# Patient Record
Sex: Female | Born: 1960 | Race: White | Hispanic: No | Marital: Married | Smoking: Never smoker
Health system: Southern US, Community
[De-identification: ages and names within clinical notes are randomized; demographics above are authoritative.]

## PROBLEM LIST (undated history)

## (undated) DIAGNOSIS — Z8709 Personal history of other diseases of the respiratory system: Secondary | ICD-10-CM

## (undated) DIAGNOSIS — G4700 Insomnia, unspecified: Secondary | ICD-10-CM

## (undated) DIAGNOSIS — I10 Essential (primary) hypertension: Secondary | ICD-10-CM

## (undated) DIAGNOSIS — I341 Nonrheumatic mitral (valve) prolapse: Secondary | ICD-10-CM

## (undated) DIAGNOSIS — R0602 Shortness of breath: Secondary | ICD-10-CM

## (undated) DIAGNOSIS — M1990 Unspecified osteoarthritis, unspecified site: Secondary | ICD-10-CM

## (undated) DIAGNOSIS — R1900 Intra-abdominal and pelvic swelling, mass and lump, unspecified site: Secondary | ICD-10-CM

## (undated) DIAGNOSIS — N390 Urinary tract infection, site not specified: Secondary | ICD-10-CM

## (undated) DIAGNOSIS — J189 Pneumonia, unspecified organism: Secondary | ICD-10-CM

## (undated) DIAGNOSIS — K5900 Constipation, unspecified: Secondary | ICD-10-CM

## (undated) DIAGNOSIS — K649 Unspecified hemorrhoids: Secondary | ICD-10-CM

## (undated) DIAGNOSIS — Z950 Presence of cardiac pacemaker: Secondary | ICD-10-CM

## (undated) DIAGNOSIS — I509 Heart failure, unspecified: Secondary | ICD-10-CM

## (undated) DIAGNOSIS — E785 Hyperlipidemia, unspecified: Secondary | ICD-10-CM

## (undated) DIAGNOSIS — M2550 Pain in unspecified joint: Secondary | ICD-10-CM

## (undated) DIAGNOSIS — Z8614 Personal history of Methicillin resistant Staphylococcus aureus infection: Secondary | ICD-10-CM

## (undated) DIAGNOSIS — M62838 Other muscle spasm: Secondary | ICD-10-CM

## (undated) DIAGNOSIS — I776 Arteritis, unspecified: Secondary | ICD-10-CM

## (undated) DIAGNOSIS — G459 Transient cerebral ischemic attack, unspecified: Secondary | ICD-10-CM

## (undated) HISTORY — PX: OTHER SURGICAL HISTORY: SHX169

## (undated) HISTORY — PX: ANKLE SURGERY: SHX546

## (undated) HISTORY — PX: ATRIAL CARDIAC PACEMAKER INSERTION: SHX561

## (undated) HISTORY — PX: CARDIAC DEFIBRILLATOR PLACEMENT: SHX171

## (undated) HISTORY — PX: COLONOSCOPY: SHX174

## (undated) HISTORY — PX: LEFT VENTRICULAR ASSIST DEVICE: SHX2679

## (undated) HISTORY — PX: BLADDER SUSPENSION: SHX72

---

## 1998-05-06 ENCOUNTER — Other Ambulatory Visit: Admission: RE | Admit: 1998-05-06 | Discharge: 1998-05-06 | Payer: Self-pay | Admitting: Gynecology

## 2000-10-17 ENCOUNTER — Other Ambulatory Visit: Admission: RE | Admit: 2000-10-17 | Discharge: 2000-10-17 | Payer: Self-pay | Admitting: Gynecology

## 2001-02-06 ENCOUNTER — Encounter: Admission: RE | Admit: 2001-02-06 | Discharge: 2001-05-07 | Payer: Self-pay | Admitting: Gynecology

## 2005-04-14 ENCOUNTER — Ambulatory Visit (HOSPITAL_COMMUNITY): Admission: RE | Admit: 2005-04-14 | Discharge: 2005-04-14 | Payer: Self-pay | Admitting: Gynecology

## 2005-04-17 ENCOUNTER — Inpatient Hospital Stay (HOSPITAL_COMMUNITY): Admission: RE | Admit: 2005-04-17 | Discharge: 2005-04-20 | Payer: Self-pay | Admitting: Gynecology

## 2008-01-10 DIAGNOSIS — G459 Transient cerebral ischemic attack, unspecified: Secondary | ICD-10-CM

## 2008-01-10 HISTORY — DX: Transient cerebral ischemic attack, unspecified: G45.9

## 2010-01-09 DIAGNOSIS — Z8614 Personal history of Methicillin resistant Staphylococcus aureus infection: Secondary | ICD-10-CM

## 2010-01-09 HISTORY — DX: Personal history of Methicillin resistant Staphylococcus aureus infection: Z86.14

## 2013-01-09 HISTORY — PX: CARDIAC CATHETERIZATION: SHX172

## 2013-02-17 ENCOUNTER — Other Ambulatory Visit: Payer: Self-pay | Admitting: Orthopedic Surgery

## 2013-02-17 DIAGNOSIS — M542 Cervicalgia: Secondary | ICD-10-CM

## 2013-02-19 ENCOUNTER — Ambulatory Visit
Admission: RE | Admit: 2013-02-19 | Discharge: 2013-02-19 | Disposition: A | Discharge status: Home / Self Care | Payer: Managed Care, Other (non HMO) | Source: Ambulatory Visit | Attending: Orthopedic Surgery | Admitting: Orthopedic Surgery

## 2013-02-19 VITALS — BP 100/61 | HR 76

## 2013-02-19 DIAGNOSIS — M542 Cervicalgia: Secondary | ICD-10-CM

## 2013-02-19 MED ORDER — DIAZEPAM 5 MG PO TABS
10.0000 mg | ORAL_TABLET | Freq: Once | ORAL | Status: AC
  Administered 2013-02-19: 10 mg via ORAL

## 2013-02-19 MED ORDER — IOHEXOL 300 MG/ML  SOLN
10.0000 mL | Freq: Once | INTRAMUSCULAR | Status: AC | PRN
  Administered 2013-02-19: 10 mL via INTRATHECAL

## 2013-02-19 MED ORDER — HYDROMORPHONE HCL PF 1 MG/ML IJ SOLN
1.0000 mg | Freq: Once | INTRAMUSCULAR | Status: AC
  Administered 2013-02-19: 1 mg via INTRAMUSCULAR

## 2013-02-19 MED ORDER — ONDANSETRON HCL 4 MG/2ML IJ SOLN: 4.0000 mg | Freq: Four times a day (QID) | INTRAMUSCULAR | Status: DC | PRN

## 2013-02-19 MED ORDER — ONDANSETRON HCL 4 MG/2ML IJ SOLN
4.0000 mg | Freq: Once | INTRAMUSCULAR | Status: AC
  Administered 2013-02-19: 4 mg via INTRAMUSCULAR

## 2013-02-19 NOTE — Progress Notes (Signed)
Patient confirms she took 13-hour prep as prescribed for iodinated contrast allergy.

## 2013-02-19 NOTE — Discharge Instructions (Signed)

## 2013-06-13 ENCOUNTER — Other Ambulatory Visit: Payer: Self-pay | Admitting: Orthopedic Surgery

## 2013-06-26 ENCOUNTER — Encounter (HOSPITAL_COMMUNITY): Payer: Self-pay | Admitting: Pharmacy Technician

## 2013-06-26 NOTE — Pre-Procedure Instructions (Addendum)
Veronica PaschalChristy Walker  06/26/2013   Your procedure is scheduled on:  Wed, June 24 @ 1:40 PM  Report to  at [facility] A @ 10:00 AM.  Call this number if you have problems the morning of surgery: [phone]   Remember:   Do not eat food or drink liquids after midnight.   Take these medicines the morning of surgery with A SIP OF WATER: Ativan(Lorazepam) and Metoprolol(Toprol)               Stop taking your Fish Oil and Aspirin. No Goody's,BC's,Aleve,Aspirin,Ibuprofen,or any Herbal Medications   Do not wear jewelry, make-up or nail polish.  Do not wear lotions, powders, or perfumes. You may wear deodorant.  Do not shave 48 hours prior to surgery.   Do not bring valuables to the hospital.  [facility] is not responsible                  for any belongings or valuables.               Contacts, dentures or bridgework may not be worn into surgery.  Leave suitcase in the car. After surgery it may be brought to your room.  For patients admitted to the hospital, discharge time is determined by your                treatment team.               Patients discharged the day of surgery will not be allowed to drive  home.    Special Instructions:   - Preparing for Surgery  Before surgery, you can play an important role.  Because skin is not sterile, your skin needs to be as free of germs as possible.  You can reduce the number of germs on you skin by washing with CHG (chlorahexidine gluconate) soap before surgery.  CHG is an antiseptic cleaner which kills germs and bonds with the skin to continue killing germs even after washing.  Please DO NOT use if you have an allergy to CHG or antibacterial soaps.  If your skin becomes reddened/irritated stop using the CHG and inform your nurse when you arrive at Short Stay.  Do not shave (including legs and underarms) for at least 48 hours prior to the first CHG shower.  You may shave your face.  Please follow these instructions  carefully:   1.  Shower with CHG Soap the night before surgery and the                                morning of Surgery.  2.  If you choose to wash your hair, wash your hair first as usual with your       normal shampoo.  3.  After you shampoo, rinse your hair and body thoroughly to remove the                      Shampoo.  4.  Use CHG as you would any other liquid soap.  You can apply chg directly       to the skin and wash gently with scrungie or a clean washcloth.  5.  Apply the CHG Soap to your body ONLY FROM THE NECK DOWN.        Do not use on open wounds or open sores.  Avoid contact with your eyes,  ears, mouth and genitals (private parts).  Wash genitals (private parts)       with your normal soap.  6.  Wash thoroughly, paying special attention to the area where your surgery        will be performed.  7.  Thoroughly rinse your body with warm water from the neck down.  8.  DO NOT shower/wash with your normal soap after using and rinsing off       the CHG Soap.  9.  Pat yourself dry with a clean towel.            10.  Wear clean pajamas.            11.  Place clean sheets on your bed the night of your first shower and do not        sleep with pets.  Day of Surgery  Do not apply any lotions/deoderants the morning of surgery.  Please wear clean clothes to the hospital/surgery center.     Please read over the following fact sheets that you were given: Pain Booklet, Coughing and Deep Breathing, Blood Transfusion Information, MRSA Information and Surgical Site Infection Prevention

## 2013-06-27 ENCOUNTER — Ambulatory Visit (HOSPITAL_COMMUNITY)
Admission: RE | Admit: 2013-06-27 | Discharge: 2013-06-27 | Disposition: A | Discharge status: Home / Self Care | Payer: Worker's Compensation | Source: Ambulatory Visit | Attending: Orthopedic Surgery | Admitting: Orthopedic Surgery

## 2013-06-27 ENCOUNTER — Encounter (HOSPITAL_COMMUNITY): Payer: Self-pay

## 2013-06-27 ENCOUNTER — Encounter (HOSPITAL_COMMUNITY)
Admission: RE | Admit: 2013-06-27 | Discharge: 2013-06-27 | Disposition: A | Discharge status: Home / Self Care | Payer: Worker's Compensation | Source: Ambulatory Visit | Attending: Orthopedic Surgery | Admitting: Orthopedic Surgery

## 2013-06-27 DIAGNOSIS — R05 Cough: Secondary | ICD-10-CM | POA: Diagnosis not present

## 2013-06-27 DIAGNOSIS — Z01818 Encounter for other preprocedural examination: Secondary | ICD-10-CM | POA: Diagnosis present

## 2013-06-27 DIAGNOSIS — R059 Cough, unspecified: Secondary | ICD-10-CM | POA: Insufficient documentation

## 2013-06-27 HISTORY — DX: Hyperlipidemia, unspecified: E78.5

## 2013-06-27 HISTORY — DX: Constipation, unspecified: K59.00

## 2013-06-27 HISTORY — DX: Insomnia, unspecified: G47.00

## 2013-06-27 HISTORY — DX: Presence of cardiac pacemaker: Z95.0

## 2013-06-27 HISTORY — DX: Personal history of Methicillin resistant Staphylococcus aureus infection: Z86.14

## 2013-06-27 HISTORY — DX: Unspecified hemorrhoids: K64.9

## 2013-06-27 HISTORY — DX: Nonrheumatic mitral (valve) prolapse: I34.1

## 2013-06-27 HISTORY — DX: Other muscle spasm: M62.838

## 2013-06-27 HISTORY — DX: Arteritis, unspecified: I77.6

## 2013-06-27 HISTORY — DX: Pain in unspecified joint: M25.50

## 2013-06-27 HISTORY — DX: Essential (primary) hypertension: I10

## 2013-06-27 HISTORY — DX: Shortness of breath: R06.02

## 2013-06-27 HISTORY — DX: Unspecified osteoarthritis, unspecified site: M19.90

## 2013-06-27 HISTORY — DX: Intra-abdominal and pelvic swelling, mass and lump, unspecified site: R19.00

## 2013-06-27 HISTORY — DX: Heart failure, unspecified: I50.9

## 2013-06-27 HISTORY — DX: Pneumonia, unspecified organism: J18.9

## 2013-06-27 HISTORY — DX: Transient cerebral ischemic attack, unspecified: G45.9

## 2013-06-27 HISTORY — DX: Personal history of other diseases of the respiratory system: Z87.09

## 2013-06-27 LAB — CBC WITH DIFFERENTIAL/PLATELET
BASOS ABS: 0 10*3/uL (ref 0.0–0.1)
Basophils Relative: 0 % (ref 0–1)
EOS PCT: 4 % (ref 0–5)
Eosinophils Absolute: 0.1 10*3/uL (ref 0.0–0.7)
HCT: 41.8 % (ref 36.0–46.0)
HEMOGLOBIN: 13.8 g/dL (ref 12.0–15.0)
LYMPHS ABS: 1.1 10*3/uL (ref 0.7–4.0)
LYMPHS PCT: 27 % (ref 12–46)
MCH: 28.6 pg (ref 26.0–34.0)
MCHC: 33 g/dL (ref 30.0–36.0)
MCV: 86.5 fL (ref 78.0–100.0)
MONO ABS: 0.4 10*3/uL (ref 0.1–1.0)
MONOS PCT: 10 % (ref 3–12)
Neutro Abs: 2.3 10*3/uL (ref 1.7–7.7)
Neutrophils Relative %: 59 % (ref 43–77)
Platelets: 186 10*3/uL (ref 150–400)
RBC: 4.83 MIL/uL (ref 3.87–5.11)
RDW: 14.1 % (ref 11.5–15.5)
WBC: 4 10*3/uL (ref 4.0–10.5)

## 2013-06-27 LAB — URINALYSIS, ROUTINE W REFLEX MICROSCOPIC
Bilirubin Urine: NEGATIVE
GLUCOSE, UA: NEGATIVE mg/dL
Hgb urine dipstick: NEGATIVE
Ketones, ur: NEGATIVE mg/dL
Nitrite: NEGATIVE
PH: 6.5 (ref 5.0–8.0)
Protein, ur: NEGATIVE mg/dL
Specific Gravity, Urine: 1.012 (ref 1.005–1.030)
Urobilinogen, UA: 0.2 mg/dL (ref 0.0–1.0)

## 2013-06-27 LAB — COMPREHENSIVE METABOLIC PANEL
ALT: 15 U/L (ref 0–35)
AST: 19 U/L (ref 0–37)
Albumin: 4 g/dL (ref 3.5–5.2)
Alkaline Phosphatase: 79 U/L (ref 39–117)
BILIRUBIN TOTAL: 0.7 mg/dL (ref 0.3–1.2)
BUN: 21 mg/dL (ref 6–23)
CHLORIDE: 103 meq/L (ref 96–112)
CO2: 29 meq/L (ref 19–32)
CREATININE: 0.96 mg/dL (ref 0.50–1.10)
Calcium: 10.2 mg/dL (ref 8.4–10.5)
GFR calc Af Amer: 78 mL/min — ABNORMAL LOW (ref >90)
GFR, EST NON AFRICAN AMERICAN: 67 mL/min — AB (ref >90)
GLUCOSE: 91 mg/dL (ref 70–99)
Potassium: 4.3 mEq/L (ref 3.7–5.3)
Sodium: 143 mEq/L (ref 137–147)
Total Protein: 7.1 g/dL (ref 6.0–8.3)

## 2013-06-27 LAB — SURGICAL PCR SCREEN
MRSA, PCR: NEGATIVE
Staphylococcus aureus: POSITIVE — AB

## 2013-06-27 LAB — TYPE AND SCREEN
ABO/RH(D): A POS
Antibody Screen: NEGATIVE

## 2013-06-27 LAB — PROTIME-INR
INR: 0.94 (ref 0.00–1.49)
PROTHROMBIN TIME: 12.4 s (ref 11.6–15.2)

## 2013-06-27 LAB — URINE MICROSCOPIC-ADD ON

## 2013-06-27 LAB — APTT: aPTT: 29 seconds (ref 24–37)

## 2013-06-27 LAB — ABO/RH: ABO/RH(D): A POS

## 2013-06-27 MED ORDER — POVIDONE-IODINE 7.5 % EX SOLN: Freq: Once | CUTANEOUS | Status: DC

## 2013-06-27 NOTE — Progress Notes (Signed)
Spoke with Dr.Smith about pt and history,he doesn't need to see pt today but just to make sure records and [facility]

## 2013-06-27 NOTE — Progress Notes (Signed)
[facility]last visit 2wks [facility]  Denies ever having a stress test  Echo and Heart cath done in APril [facility]  EKG done 05/19/13-[facility]  Denies CXR in past yr  Medical Md is with [facility]phanie [facility]ola [facility] [address]

## 2013-06-27 NOTE — Progress Notes (Signed)
[employer] rep notified of pt upcoming surgery 07/02/13 @ 1:40 PM

## 2013-06-28 NOTE — Progress Notes (Signed)
[facility]iro[facility]. Call and spoke with Arpita, RPh and called in prescription for mupirocin per short stay protocol. Patient notified.

## 2013-06-30 NOTE — Progress Notes (Addendum)
Anesthesia Chart Review: Patient is a 53 year old female scheduled for C5-6, C6-7 ACDF on 07/02/13 by Dr. Yevette Edwardsumonski.  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 Lolita LenzEdward Hal Kincaid), s/p [facility] "defibrillator" 01/05/2012, chronic systolic CHF (EF 09-81%15-20% 04/2013 echo), TIA '10, chronic DOE, arthritis, vasculitis, insomnia, MRSA '12. PCP is [place] Lau[place]phanie [place] [facility] ([address]  Rheumatologist is Dr. Denyse DagoAmer Al-Khoudari with [facility] who cleared her from his standpoint.  He gave permission to hold Imuran preoperatively.  Primary cardiologist is Dr. Ernest Haberhukwu with [facility].  She was seen by her partner Dr. Janett BillowBarbara Ann Pisani at [facility] for preoperative clearance on 06/17/13. Her note states, "There is no cardiovascular contraindication for your proposed surgery. Based on revised Nedra HaiLee criteria her risk of a periop event (MI, PE, VF, cardiac arrest, or CHD) is 6.6%, due to  non-modifiable risk factors of CAD and CHF. However, risk is lower (0.46%) based on [facility] perioperative assessment..." (See entire note in patient's hard chart.)   [facility] showed: Severely dilated LV, normal LV wall thickness, LV systolic function severely reduced with EF 15-20%, LV filling pattern in indeterminate, severe global hypokinesis of the LV.  [place] lead in the RV.  Normal RV size and function. Mildly dilated LA.    Cardiac cath on 04/24/13 [facility]) showed: Obstructive two-vessel CAD. Left main disease. Aortogram showed occluded left main and patent RCA. Left main occluded at ostium. LIMA to LAD patent. RCA widely patent with brisk collateral filling of left circumflex. Pressures: Site Baseline Values, mm Hg S/P  angiography, mm Hg S/D mean S/D mean: RA 7 / 7 6  RV 33 / 6 PA 39 / 19 27 PCW 17 / 17 13 Oximetry: Site % O2 sat RA 68 PR 68 AO 96  CXR on 06/27/13 showed: FINDINGS: Interval sequelae of median sternotomy, cardiac valve replacement, and left chest cardiac AICD placement. Stable cardiac size and mediastinal contours. Small right cardiophrenic angle pericardial cyst incidentally re- identified. Visualized tracheal air column is  within normal limits. No pneumothorax, pulmonary edema, pleural effusion or acute pulmonary opacity. No acute osseous abnormality identified. IMPRESSION: No acute cardiopulmonary abnormality.  Preoperative labs noted. UA showed large leukocytes but negative nitrites, few bacteria.  CBC with diff WNL (WBC low normal at 4.0).  WBC and [facility] at [facility]. Marshell Levanumonski's office.    [facility] was from 02/28/12, so a more recent baseline EKG will need to be done on the day of surgery.  I did receive the completed the perioperative cardiac device form indicating that patient does have a [employer] ICD.  Magnet placement with post-operative interrogation recommended. Last interrogation 05/19/13.  I will ask our nursing staff to notify the rep.  She will be re-evaluated by her assigned anesthesiologist on the day of surgery to ensure no acute CV/CHF symptoms. EKG will be done.  If no acute changes then I would anticipate that she could proceed as planned.  Velna Ochsllison Zelenak, PA-C [facility] Short Stay Center/Anesthesiology Phone [phone] 07/01/2013 10:28 AM

## 2013-07-01 ENCOUNTER — Encounter (HOSPITAL_COMMUNITY): Payer: Self-pay

## 2013-07-01 MED ORDER — CEFAZOLIN SODIUM-DEXTROSE 2-3 GM-% IV SOLR
2.0000 g | INTRAVENOUS | Status: AC
  Administered 2013-07-02: 2 g via INTRAVENOUS
  Filled 2013-07-01: qty 50

## 2013-07-01 NOTE — H&P (Signed)
PREOPERATIVE H&P  Chief Complaint: left arm pain  HPI: Veronica Walker is a 53 y.o. female who presents with ongoing pain in the left arm  MRI reveals left NF stenosis C5-7  Patient has failed multiple forms of conservative care and continues to have pain (see office notes for additional details regarding the patient's full course of treatment)  Past Medical History  Diagnosis Date  . Hyperlipidemia     takes Atorvastatin daily  . Muscle spasm     takes Flexeril daily  . Hypertension   . Vasculitis     takes Lisinopril and Metoprolol daily  . Abdominal swelling     takes Furosemide daily  . MVP (mitral valve prolapse)   . Shortness of breath     with exertion  . Pneumonia 4246yrs ago  . History of bronchitis   . TIA (transient ischemic attack) 2010  . Arthritis   . Joint pain   . Hemorrhoids   . Constipation     takes Colace daily  . Insomnia     d/t pain and unknown other reasons  . History of MRSA infection 2012  . CHF (congestive heart failure)     chronic systolic CHF  . Pacemaker     atrial cardiac pacemaker; s/p [employer] ICD 01/05/12   Past Surgical History  Procedure Laterality Date  . Ankle surgery Bilateral   . Mitral and aortic valve replaced  2010/2012    [facility]  . Partial hysterecomy    . I&d to right abdomen      d/t MRSA  . Cardiac catheterization  2015  . Colonoscopy    . Bladder suspension    . Atrial cardiac pacemaker insertion    . Cardiac defibrillator placement      [employer]    History   Social History  . Marital Status: Married    Spouse Name: N/A    Number of Children: N/A  . Years of Education: N/A   Social History Main Topics  . Smoking status: Never Smoker   . Smokeless tobacco: Not on file  . Alcohol Use: Yes     Comment: 2-3 times a yr wine  . Drug Use: No  . Sexual Activity: Yes    Birth Control/ Protection: Surgical   Other Topics Concern  . Not on file   Social History Narrative    . No narrative on file   No family history on file. Allergies  Allergen Reactions  . Contrast Media [Iodinated Diagnostic Agents] Shortness Of Breath, Itching and Other (See Comments)    A sensation of burning all over   Pre-med 13 hour prep  . Morphine And Related Nausea Only   Prior to Admission medications   Medication Sig Start Date End Date Taking? Authorizing Provider  aspirin EC 81 MG tablet Take 81 mg by mouth daily.   Yes Historical Provider, MD  atorvastatin (LIPITOR) 40 MG tablet Take 40 mg by mouth daily.   Yes Historical Provider, MD  azaTHIOprine (IMURAN) 50 MG tablet Take 50 mg by mouth 3 (three) times daily.   Yes Historical Provider, MD  CALCIUM-VITAMIN D PO Take 1 tablet by mouth daily.   Yes Historical Provider, MD  cyclobenzaprine (FLEXERIL) 5 MG tablet Take 5 mg by mouth 3 (three) times daily.   Yes Historical Provider, MD  docusate sodium (COLACE) 100 MG capsule Take 100 mg by mouth daily.   Yes Historical Provider, MD  furosemide (LASIX) 40  MG tablet Take 40 mg by mouth daily.   Yes Historical Provider, MD  lisinopril (PRINIVIL,ZESTRIL) 2.5 MG tablet Take 5 mg by mouth 2 (two) times daily.   Yes Historical Provider, MD  LORazepam (ATIVAN) 0.5 MG tablet Take 0.5 mg by mouth every 8 (eight) hours as needed for anxiety or sleep.   Yes Historical Provider, MD  metoprolol succinate (TOPROL-XL) 25 MG 24 hr tablet Take 25-50 mg by mouth 2 (two) times daily. Take 50mg  in the morning and take 25mg  at bedtime.   Yes Historical Provider, MD  Omega-3 Fatty Acids (FISH OIL) 1000 MG CAPS Take 2,000 mg by mouth daily.   Yes Historical Provider, MD  potassium chloride (K-DUR,KLOR-CON) 10 MEQ tablet Take 10 mEq by mouth daily.   Yes Historical Provider, MD     All other systems have been reviewed and were otherwise negative with the exception of those mentioned in the HPI and as above.  Physical Exam: There were no vitals filed for this visit.  General: Alert, no acute  distress Cardiovascular: No pedal edema Respiratory: No cyanosis, no use of accessory musculature Skin: No lesions in the area of chief complaint Neurologic: Sensation intact distally Psychiatric: Patient is competent for consent with normal mood and affect Lymphatic: No axillary or cervical lymphadenopathy  MUSCULOSKELETAL: + spurling's on left  Assessment/Plan: Neck and left arm pain Plan for Procedure(s): ANTERIOR CERVICAL DECOMPRESSION/DISCECTOMY FUSION  C5-7   Emilee HeroUMONSKI,MARK LEONARD, MD 07/01/2013 10:28 PM

## 2013-07-02 ENCOUNTER — Ambulatory Visit (HOSPITAL_COMMUNITY): Payer: Worker's Compensation | Admitting: Anesthesiology

## 2013-07-02 ENCOUNTER — Encounter (HOSPITAL_COMMUNITY): Payer: Worker's Compensation | Admitting: Vascular Surgery

## 2013-07-02 ENCOUNTER — Observation Stay (HOSPITAL_COMMUNITY)
Admission: RE | Admit: 2013-07-02 | Discharge: 2013-07-03 | Disposition: A | Discharge status: Home / Self Care | Payer: Worker's Compensation | Source: Ambulatory Visit | Attending: Orthopedic Surgery | Admitting: Orthopedic Surgery

## 2013-07-02 ENCOUNTER — Encounter (HOSPITAL_COMMUNITY): Payer: Self-pay | Admitting: *Deleted

## 2013-07-02 ENCOUNTER — Ambulatory Visit (HOSPITAL_COMMUNITY): Payer: Worker's Compensation

## 2013-07-02 ENCOUNTER — Encounter (HOSPITAL_COMMUNITY)
Admission: RE | Disposition: A | Discharge status: Home / Self Care | Payer: Self-pay | Source: Ambulatory Visit | Attending: Orthopedic Surgery

## 2013-07-02 DIAGNOSIS — I5022 Chronic systolic (congestive) heart failure: Secondary | ICD-10-CM | POA: Insufficient documentation

## 2013-07-02 DIAGNOSIS — Z7982 Long term (current) use of aspirin: Secondary | ICD-10-CM | POA: Insufficient documentation

## 2013-07-02 DIAGNOSIS — Z8673 Personal history of transient ischemic attack (TIA), and cerebral infarction without residual deficits: Secondary | ICD-10-CM | POA: Insufficient documentation

## 2013-07-02 DIAGNOSIS — M5410 Radiculopathy, site unspecified: Secondary | ICD-10-CM | POA: Diagnosis present

## 2013-07-02 DIAGNOSIS — G4700 Insomnia, unspecified: Secondary | ICD-10-CM | POA: Insufficient documentation

## 2013-07-02 DIAGNOSIS — Z79899 Other long term (current) drug therapy: Secondary | ICD-10-CM | POA: Insufficient documentation

## 2013-07-02 DIAGNOSIS — K5900 Constipation, unspecified: Secondary | ICD-10-CM | POA: Insufficient documentation

## 2013-07-02 DIAGNOSIS — M5412 Radiculopathy, cervical region: Secondary | ICD-10-CM

## 2013-07-02 DIAGNOSIS — I059 Rheumatic mitral valve disease, unspecified: Secondary | ICD-10-CM | POA: Insufficient documentation

## 2013-07-02 DIAGNOSIS — M5030 Other cervical disc degeneration, unspecified cervical region: Principal | ICD-10-CM | POA: Insufficient documentation

## 2013-07-02 DIAGNOSIS — I509 Heart failure, unspecified: Secondary | ICD-10-CM | POA: Insufficient documentation

## 2013-07-02 DIAGNOSIS — I10 Essential (primary) hypertension: Secondary | ICD-10-CM | POA: Insufficient documentation

## 2013-07-02 DIAGNOSIS — Z8614 Personal history of Methicillin resistant Staphylococcus aureus infection: Secondary | ICD-10-CM | POA: Insufficient documentation

## 2013-07-02 DIAGNOSIS — I776 Arteritis, unspecified: Secondary | ICD-10-CM | POA: Insufficient documentation

## 2013-07-02 DIAGNOSIS — M62838 Other muscle spasm: Secondary | ICD-10-CM | POA: Insufficient documentation

## 2013-07-02 DIAGNOSIS — Z950 Presence of cardiac pacemaker: Secondary | ICD-10-CM | POA: Insufficient documentation

## 2013-07-02 DIAGNOSIS — E785 Hyperlipidemia, unspecified: Secondary | ICD-10-CM | POA: Insufficient documentation

## 2013-07-02 DIAGNOSIS — M129 Arthropathy, unspecified: Secondary | ICD-10-CM | POA: Insufficient documentation

## 2013-07-02 HISTORY — PX: ANTERIOR CERVICAL DECOMP/DISCECTOMY FUSION: SHX1161

## 2013-07-02 HISTORY — DX: Urinary tract infection, site not specified: N39.0

## 2013-07-02 SURGERY — ANTERIOR CERVICAL DECOMPRESSION/DISCECTOMY FUSION 2 LEVELS
Anesthesia: General | Site: Neck

## 2013-07-02 MED ORDER — SODIUM CHLORIDE 0.9 % IJ SOLN
3.0000 mL | Freq: Two times a day (BID) | INTRAMUSCULAR | Status: DC
  Administered 2013-07-02: 3 mL via INTRAVENOUS

## 2013-07-02 MED ORDER — LORAZEPAM 0.5 MG PO TABS: 0.5000 mg | ORAL_TABLET | Freq: Three times a day (TID) | ORAL | Status: DC | PRN

## 2013-07-02 MED ORDER — ONDANSETRON HCL 4 MG/2ML IJ SOLN
INTRAMUSCULAR | Status: AC
  Filled 2013-07-02: qty 2

## 2013-07-02 MED ORDER — OXYCODONE HCL 5 MG/5ML PO SOLN: 5.0000 mg | Freq: Once | ORAL | Status: AC | PRN

## 2013-07-02 MED ORDER — ETOMIDATE 2 MG/ML IV SOLN
INTRAVENOUS | Status: AC
  Filled 2013-07-02: qty 10

## 2013-07-02 MED ORDER — MIDAZOLAM HCL 2 MG/2ML IJ SOLN
INTRAMUSCULAR | Status: AC
  Filled 2013-07-02: qty 2

## 2013-07-02 MED ORDER — FUROSEMIDE 40 MG PO TABS
40.0000 mg | ORAL_TABLET | Freq: Every day | ORAL | Status: DC
  Administered 2013-07-03: 40 mg via ORAL
  Filled 2013-07-02: qty 1

## 2013-07-02 MED ORDER — DOCUSATE SODIUM 100 MG PO CAPS
100.0000 mg | ORAL_CAPSULE | Freq: Two times a day (BID) | ORAL | Status: DC
  Administered 2013-07-03: 100 mg via ORAL
  Filled 2013-07-02 (×3): qty 1

## 2013-07-02 MED ORDER — OXYCODONE HCL 5 MG PO TABS
5.0000 mg | ORAL_TABLET | Freq: Once | ORAL | Status: AC | PRN
  Administered 2013-07-02: 5 mg via ORAL

## 2013-07-02 MED ORDER — DIAZEPAM 5 MG PO TABS: 5.0000 mg | ORAL_TABLET | Freq: Four times a day (QID) | ORAL | Status: DC | PRN

## 2013-07-02 MED ORDER — HYDROMORPHONE HCL PF 1 MG/ML IJ SOLN
0.2500 mg | INTRAMUSCULAR | Status: DC | PRN
  Administered 2013-07-02 (×2): 0.5 mg via INTRAVENOUS

## 2013-07-02 MED ORDER — SENNOSIDES-DOCUSATE SODIUM 8.6-50 MG PO TABS: 1.0000 | ORAL_TABLET | Freq: Every evening | ORAL | Status: DC | PRN

## 2013-07-02 MED ORDER — SODIUM CHLORIDE 0.9 % IJ SOLN
INTRAMUSCULAR | Status: AC
  Filled 2013-07-02: qty 10

## 2013-07-02 MED ORDER — LIDOCAINE HCL (CARDIAC) 20 MG/ML IV SOLN
INTRAVENOUS | Status: AC
  Filled 2013-07-02: qty 5

## 2013-07-02 MED ORDER — THROMBIN 20000 UNITS EX SOLR
CUTANEOUS | Status: AC
  Filled 2013-07-02: qty 20000

## 2013-07-02 MED ORDER — DOCUSATE SODIUM 100 MG PO CAPS: 100.0000 mg | ORAL_CAPSULE | Freq: Every day | ORAL | Status: DC

## 2013-07-02 MED ORDER — ROCURONIUM BROMIDE 100 MG/10ML IV SOLN
INTRAVENOUS | Status: DC | PRN
  Administered 2013-07-02: 50 mg via INTRAVENOUS

## 2013-07-02 MED ORDER — PROMETHAZINE HCL 25 MG/ML IJ SOLN
12.5000 mg | Freq: Four times a day (QID) | INTRAMUSCULAR | Status: DC | PRN
  Administered 2013-07-02: 12.5 mg via INTRAVENOUS
  Filled 2013-07-02: qty 1

## 2013-07-02 MED ORDER — PROMETHAZINE HCL 25 MG PO TABS: 12.5000 mg | ORAL_TABLET | Freq: Four times a day (QID) | ORAL | Status: DC | PRN

## 2013-07-02 MED ORDER — ROCURONIUM BROMIDE 50 MG/5ML IV SOLN
INTRAVENOUS | Status: AC
  Filled 2013-07-02: qty 1

## 2013-07-02 MED ORDER — PROPOFOL 10 MG/ML IV BOLUS
INTRAVENOUS | Status: AC
  Filled 2013-07-02: qty 20

## 2013-07-02 MED ORDER — ZOLPIDEM TARTRATE 5 MG PO TABS: 5.0000 mg | ORAL_TABLET | Freq: Every evening | ORAL | Status: DC | PRN

## 2013-07-02 MED ORDER — BISACODYL 5 MG PO TBEC: 5.0000 mg | DELAYED_RELEASE_TABLET | Freq: Every day | ORAL | Status: DC | PRN

## 2013-07-02 MED ORDER — FLEET ENEMA 7-19 GM/118ML RE ENEM
1.0000 | ENEMA | Freq: Once | RECTAL | Status: AC | PRN
  Filled 2013-07-02: qty 1

## 2013-07-02 MED ORDER — ALUM & MAG HYDROXIDE-SIMETH 200-200-20 MG/5ML PO SUSP: 30.0000 mL | Freq: Four times a day (QID) | ORAL | Status: DC | PRN

## 2013-07-02 MED ORDER — ONDANSETRON HCL 4 MG/2ML IJ SOLN
INTRAMUSCULAR | Status: DC | PRN
  Administered 2013-07-02: 4 mg via INTRAVENOUS

## 2013-07-02 MED ORDER — LISINOPRIL 5 MG PO TABS
5.0000 mg | ORAL_TABLET | Freq: Two times a day (BID) | ORAL | Status: DC
  Administered 2013-07-03: 5 mg via ORAL
  Filled 2013-07-02 (×3): qty 1

## 2013-07-02 MED ORDER — PROMETHAZINE HCL 25 MG/ML IJ SOLN: 6.2500 mg | INTRAMUSCULAR | Status: DC | PRN

## 2013-07-02 MED ORDER — SODIUM CHLORIDE 0.9 % IJ SOLN: 3.0000 mL | INTRAMUSCULAR | Status: DC | PRN

## 2013-07-02 MED ORDER — OXYCODONE-ACETAMINOPHEN 5-325 MG PO TABS: 1.0000 | ORAL_TABLET | ORAL | Status: DC | PRN

## 2013-07-02 MED ORDER — FENTANYL CITRATE 0.05 MG/ML IJ SOLN
INTRAMUSCULAR | Status: AC
  Filled 2013-07-02: qty 5

## 2013-07-02 MED ORDER — ARTIFICIAL TEARS OP OINT
TOPICAL_OINTMENT | OPHTHALMIC | Status: AC
  Filled 2013-07-02: qty 3.5

## 2013-07-02 MED ORDER — MENTHOL 3 MG MT LOZG
1.0000 | LOZENGE | OROMUCOSAL | Status: DC | PRN
  Filled 2013-07-02: qty 9

## 2013-07-02 MED ORDER — ACETAMINOPHEN 650 MG RE SUPP
650.0000 mg | RECTAL | Status: DC | PRN
Start: 2013-07-02 — End: 2013-07-03

## 2013-07-02 MED ORDER — SODIUM CHLORIDE 0.9 % IV SOLN
250.0000 mL | INTRAVENOUS | Status: DC
  Administered 2013-07-02: 250 mL via INTRAVENOUS

## 2013-07-02 MED ORDER — HEMOSTATIC AGENTS (NO CHARGE) OPTIME
TOPICAL | Status: DC | PRN
  Administered 2013-07-02: 1 via TOPICAL

## 2013-07-02 MED ORDER — ACETAMINOPHEN 325 MG PO TABS: 650.0000 mg | ORAL_TABLET | ORAL | Status: DC | PRN

## 2013-07-02 MED ORDER — METOPROLOL SUCCINATE ER 25 MG PO TB24
25.0000 mg | ORAL_TABLET | Freq: Every day | ORAL | Status: DC
  Filled 2013-07-02 (×2): qty 1

## 2013-07-02 MED ORDER — ARTIFICIAL TEARS OP OINT
TOPICAL_OINTMENT | OPHTHALMIC | Status: DC | PRN
  Administered 2013-07-02: 1 via OPHTHALMIC

## 2013-07-02 MED ORDER — ETOMIDATE 2 MG/ML IV SOLN
INTRAVENOUS | Status: DC | PRN
  Administered 2013-07-02: 20 mg via INTRAVENOUS

## 2013-07-02 MED ORDER — EPHEDRINE SULFATE 50 MG/ML IJ SOLN
INTRAMUSCULAR | Status: AC
  Filled 2013-07-02: qty 1

## 2013-07-02 MED ORDER — BUPIVACAINE-EPINEPHRINE 0.25% -1:200000 IJ SOLN
INTRAMUSCULAR | Status: DC | PRN
  Administered 2013-07-02: 5 mL

## 2013-07-02 MED ORDER — POTASSIUM CHLORIDE CRYS ER 10 MEQ PO TBCR
10.0000 meq | EXTENDED_RELEASE_TABLET | Freq: Every day | ORAL | Status: DC
  Administered 2013-07-03: 10 meq via ORAL
  Filled 2013-07-02: qty 1

## 2013-07-02 MED ORDER — ATORVASTATIN CALCIUM 40 MG PO TABS
40.0000 mg | ORAL_TABLET | Freq: Every day | ORAL | Status: DC
  Administered 2013-07-03: 40 mg via ORAL
  Filled 2013-07-02: qty 1

## 2013-07-02 MED ORDER — CEFAZOLIN SODIUM 1-5 GM-% IV SOLN
1.0000 g | Freq: Three times a day (TID) | INTRAVENOUS | Status: AC
  Administered 2013-07-02 – 2013-07-03 (×2): 1 g via INTRAVENOUS
  Filled 2013-07-02 (×2): qty 50

## 2013-07-02 MED ORDER — LIDOCAINE HCL (CARDIAC) 20 MG/ML IV SOLN
INTRAVENOUS | Status: DC | PRN
  Administered 2013-07-02: 100 mg via INTRAVENOUS

## 2013-07-02 MED ORDER — METOPROLOL SUCCINATE ER 50 MG PO TB24
50.0000 mg | ORAL_TABLET | Freq: Every day | ORAL | Status: DC
  Filled 2013-07-02: qty 1

## 2013-07-02 MED ORDER — MIDAZOLAM HCL 5 MG/5ML IJ SOLN
INTRAMUSCULAR | Status: DC | PRN
  Administered 2013-07-02: 2 mg via INTRAVENOUS

## 2013-07-02 MED ORDER — ONDANSETRON HCL 4 MG/2ML IJ SOLN
4.0000 mg | INTRAMUSCULAR | Status: DC | PRN
  Administered 2013-07-02 – 2013-07-03 (×2): 4 mg via INTRAVENOUS
  Filled 2013-07-02 (×2): qty 2

## 2013-07-02 MED ORDER — BUPIVACAINE-EPINEPHRINE (PF) 0.25% -1:200000 IJ SOLN
INTRAMUSCULAR | Status: AC
  Filled 2013-07-02: qty 30

## 2013-07-02 MED ORDER — THROMBIN 20000 UNITS EX SOLR
OROMUCOSAL | Status: DC | PRN
  Administered 2013-07-02: 15:00:00 via TOPICAL

## 2013-07-02 MED ORDER — HYDROMORPHONE HCL PF 1 MG/ML IJ SOLN
INTRAMUSCULAR | Status: AC
  Filled 2013-07-02: qty 1

## 2013-07-02 MED ORDER — PHENYLEPHRINE HCL 10 MG/ML IJ SOLN
10.0000 mg | INTRAMUSCULAR | Status: DC | PRN
  Administered 2013-07-02: 20 ug/min via INTRAVENOUS

## 2013-07-02 MED ORDER — HYDROMORPHONE HCL PF 1 MG/ML IJ SOLN
0.5000 mg | INTRAMUSCULAR | Status: DC | PRN
  Administered 2013-07-02 – 2013-07-03 (×2): 1 mg via INTRAVENOUS
  Filled 2013-07-02 (×2): qty 1

## 2013-07-02 MED ORDER — PROMETHAZINE HCL 25 MG RE SUPP: 12.5000 mg | Freq: Four times a day (QID) | RECTAL | Status: DC | PRN

## 2013-07-02 MED ORDER — OXYCODONE HCL 5 MG PO TABS
ORAL_TABLET | ORAL | Status: AC
  Filled 2013-07-02: qty 1

## 2013-07-02 MED ORDER — PHENOL 1.4 % MT LIQD
1.0000 | OROMUCOSAL | Status: DC | PRN
  Filled 2013-07-02: qty 177

## 2013-07-02 MED ORDER — FENTANYL CITRATE 0.05 MG/ML IJ SOLN
INTRAMUSCULAR | Status: DC | PRN
  Administered 2013-07-02 (×2): 50 ug via INTRAVENOUS
  Administered 2013-07-02: 100 ug via INTRAVENOUS
  Administered 2013-07-02: 50 ug via INTRAVENOUS

## 2013-07-02 MED ORDER — LACTATED RINGERS IV SOLN
INTRAVENOUS | Status: DC
  Administered 2013-07-02: 11:00:00 via INTRAVENOUS

## 2013-07-02 MED ORDER — MINERAL OIL LIGHT 100 % EX OIL
TOPICAL_OIL | CUTANEOUS | Status: AC
  Filled 2013-07-02: qty 25

## 2013-07-02 SURGICAL SUPPLY — 79 items
APL SKNCLS STERI-STRIP NONHPOA (GAUZE/BANDAGES/DRESSINGS) ×1
BENZOIN TINCTURE PRP APPL 2/3 (GAUZE/BANDAGES/DRESSINGS) ×2 IMPLANT
BIT DRILL NEURO 2X3.1 SFT TUCH (MISCELLANEOUS) ×1 IMPLANT
BIT DRILL SRG 14X2.2XFLT CHK (BIT) IMPLANT
BIT DRL SRG 14X2.2XFLT CHK (BIT) ×1
BLADE SURG 15 STRL LF DISP TIS (BLADE) ×1 IMPLANT
BLADE SURG 15 STRL SS (BLADE) ×2
BLADE SURG ROTATE 9660 (MISCELLANEOUS) ×2 IMPLANT
BUR MATCHSTICK NEURO 3.0 LAGG (BURR) IMPLANT
CARTRIDGE OIL MAESTRO DRILL (MISCELLANEOUS) ×1 IMPLANT
CLSR STERI-STRIP ANTIMIC 1/2X4 (GAUZE/BANDAGES/DRESSINGS) ×1 IMPLANT
COLLAR CERV LO CONTOUR FIRM DE (SOFTGOODS) IMPLANT
CORDS BIPOLAR (ELECTRODE) ×2 IMPLANT
COVER SURGICAL LIGHT HANDLE (MISCELLANEOUS) ×2 IMPLANT
CRADLE DONUT ADULT HEAD (MISCELLANEOUS) ×2 IMPLANT
DEVICE ENDSKLTN TCERV VBR SM 6 (Orthopedic Implant) IMPLANT
DIFFUSER DRILL AIR PNEUMATIC (MISCELLANEOUS) ×2 IMPLANT
DRAIN JACKSON RD 7FR 3/32 (WOUND CARE) IMPLANT
DRAPE C-ARM 42X72 X-RAY (DRAPES) ×2 IMPLANT
DRAPE POUCH INSTRU U-SHP 10X18 (DRAPES) ×2 IMPLANT
DRAPE SURG 17X23 STRL (DRAPES) ×6 IMPLANT
DRILL BIT SKYLINE 14MM (BIT) ×2
DRILL NEURO 2X3.1 SOFT TOUCH (MISCELLANEOUS) ×2
DURAPREP 26ML APPLICATOR (WOUND CARE) ×2 IMPLANT
ELECT COATED BLADE 2.86 ST (ELECTRODE) ×2 IMPLANT
ELECT REM PT RETURN 9FT ADLT (ELECTROSURGICAL) ×2
ELECTRODE REM PT RTRN 9FT ADLT (ELECTROSURGICAL) ×1 IMPLANT
ENDOSKELETON T CERV VBR SM 6MM (Orthopedic Implant) ×2 IMPLANT
EVACUATOR SILICONE 100CC (DRAIN) IMPLANT
GAUZE SPONGE 4X4 16PLY XRAY LF (GAUZE/BANDAGES/DRESSINGS) ×2 IMPLANT
GLOVE BIO SURGEON STRL SZ7 (GLOVE) ×2 IMPLANT
GLOVE BIO SURGEON STRL SZ8 (GLOVE) ×2 IMPLANT
GLOVE BIOGEL PI IND STRL 7.5 (GLOVE) ×2 IMPLANT
GLOVE BIOGEL PI IND STRL 8 (GLOVE) ×1 IMPLANT
GLOVE BIOGEL PI INDICATOR 7.5 (GLOVE) ×2
GLOVE BIOGEL PI INDICATOR 8 (GLOVE) ×1
GOWN STRL REUS W/ TWL LRG LVL3 (GOWN DISPOSABLE) ×1 IMPLANT
GOWN STRL REUS W/ TWL XL LVL3 (GOWN DISPOSABLE) ×1 IMPLANT
GOWN STRL REUS W/TWL LRG LVL3 (GOWN DISPOSABLE) ×2
GOWN STRL REUS W/TWL XL LVL3 (GOWN DISPOSABLE) ×2
IMPL S ENDOSKEL TC 8MM ODEG (Orthopedic Implant) IMPLANT
IMPLANT S ENDOSKEL TC 8MM ODEG (Orthopedic Implant) ×2 IMPLANT
IV CATH 14GX2 1/4 (CATHETERS) ×2 IMPLANT
KIT BASIN OR (CUSTOM PROCEDURE TRAY) ×2 IMPLANT
KIT ROOM TURNOVER OR (KITS) ×2 IMPLANT
MANIFOLD NEPTUNE II (INSTRUMENTS) ×2 IMPLANT
NDL SPNL 20GX3.5 QUINCKE YW (NEEDLE) ×1 IMPLANT
NEEDLE 27GAX1X1/2 (NEEDLE) ×2 IMPLANT
NEEDLE SPNL 20GX3.5 QUINCKE YW (NEEDLE) ×2 IMPLANT
NS IRRIG 1000ML POUR BTL (IV SOLUTION) ×2 IMPLANT
OIL CARTRIDGE MAESTRO DRILL (MISCELLANEOUS) ×2
PACK ORTHO CERVICAL (CUSTOM PROCEDURE TRAY) ×2 IMPLANT
PAD ARMBOARD 7.5X6 YLW CONV (MISCELLANEOUS) ×4 IMPLANT
PATTIES SURGICAL .5 X.5 (GAUZE/BANDAGES/DRESSINGS) IMPLANT
PATTIES SURGICAL .5 X1 (DISPOSABLE) IMPLANT
PIN DISTRACTION 14 (PIN) ×3 IMPLANT
PIN TEMP SKYLINE THREADED (PIN) ×1 IMPLANT
PLATE SKYLINE 2LVL 26MM (Plate) ×1 IMPLANT
PUTTY BONE DBX 2.5 MIS (Bone Implant) ×1 IMPLANT
SCREW SKYLINE VAR OS 14MM (Screw) ×1 IMPLANT
SCREW VAR SELF TAP SKYLINE 14M (Screw) ×5 IMPLANT
SPONGE GAUZE 4X4 12PLY (GAUZE/BANDAGES/DRESSINGS) ×2 IMPLANT
SPONGE GAUZE 4X4 12PLY STER LF (GAUZE/BANDAGES/DRESSINGS) ×1 IMPLANT
SPONGE INTESTINAL PEANUT (DISPOSABLE) ×2 IMPLANT
SPONGE SURGIFOAM ABS GEL 100 (HEMOSTASIS) ×2 IMPLANT
STRIP CLOSURE SKIN 1/2X4 (GAUZE/BANDAGES/DRESSINGS) ×2 IMPLANT
SURGIFLO TRUKIT (HEMOSTASIS) IMPLANT
SUT MNCRL AB 4-0 PS2 18 (SUTURE) ×2 IMPLANT
SUT SILK 4 0 (SUTURE)
SUT SILK 4-0 18XBRD TIE 12 (SUTURE) IMPLANT
SUT VIC AB 2-0 CT2 18 VCP726D (SUTURE) ×2 IMPLANT
SYR BULB IRRIGATION 50ML (SYRINGE) ×2 IMPLANT
SYR CONTROL 10ML LL (SYRINGE) ×4 IMPLANT
TAPE CLOTH 4X10 WHT NS (GAUZE/BANDAGES/DRESSINGS) ×2 IMPLANT
TAPE UMBILICAL COTTON 1/8X30 (MISCELLANEOUS) ×2 IMPLANT
TOWEL OR 17X24 6PK STRL BLUE (TOWEL DISPOSABLE) ×2 IMPLANT
TOWEL OR 17X26 10 PK STRL BLUE (TOWEL DISPOSABLE) ×2 IMPLANT
WATER STERILE IRR 1000ML POUR (IV SOLUTION) ×2 IMPLANT
YANKAUER SUCT BULB TIP NO VENT (SUCTIONS) ×2 IMPLANT

## 2013-07-02 NOTE — Anesthesia Preprocedure Evaluation (Addendum)
Anesthesia Evaluation  Patient identified by MRN, date of birth, ID band Patient awake    Reviewed: Allergy & Precautions, H&P , NPO status , Patient's Chart, lab work & pertinent test results  History of Anesthesia Complications Negative for: history of anesthetic complications  Airway Mallampati: I  Neck ROM: Full    Dental  (+) Teeth Intact, Dental Advisory Given, Missing   Pulmonary shortness of breath, pneumonia -,  breath sounds clear to auscultation        Cardiovascular hypertension, + CAD, + CABG and +CHF + pacemaker + Cardiac Defibrillator + Valvular Problems/Murmurs Rhythm:Regular Rate:Normal     Neuro/Psych    GI/Hepatic   Endo/Other    Renal/GU      Musculoskeletal   Abdominal   Peds  Hematology   Anesthesia Other Findings   Reproductive/Obstetrics                         Anesthesia Physical Anesthesia Plan  ASA: IV  Anesthesia Plan: General   Post-op Pain Management:    Induction: Intravenous  Airway Management Planned: Oral ETT  Additional Equipment: Arterial line  Intra-op Plan:   Post-operative Plan: Extubation in OR  Informed Consent: I have reviewed the patients History and Physical, chart, labs and discussed the procedure including the risks, benefits and alternatives for the proposed anesthesia with the patient or authorized representative who has indicated his/her understanding and acceptance.   Dental advisory given  Plan Discussed with: CRNA and Surgeon  Anesthesia Plan Comments: (Due to location of AICD , will have rep interrogate ,suspend Defib function. Will apply defib pads to patient once in room to have available. )       Anesthesia Quick Evaluation

## 2013-07-02 NOTE — Transfer of Care (Signed)
Immediate Anesthesia Transfer of Care Note  Patient: Veronica PaschalChristy Walker  Procedure(s) Performed: Procedure(s) with comments: ANTERIOR CERVICAL DECOMPRESSION/DISCECTOMY FUSION 2 LEVELS (N/A) - Anterior cervical decompression fusion cervical 5-6, cervical 6-7 with instrumentation and allograft  Patient Location: PACU  Anesthesia Type:General  Level of Consciousness: awake, alert  and oriented  Airway & Oxygen Therapy: Patient Spontanous Breathing and Patient connected to nasal cannula oxygen  Post-op Assessment: Report given to PACU RN and Patient moving all extremities X 4  Post vital signs: Reviewed and stable  Complications: No apparent anesthesia complications

## 2013-07-02 NOTE — Anesthesia Postprocedure Evaluation (Signed)
  Anesthesia Post-op Note  Patient: Veronica PaschalChristy Walker  Procedure(s) Performed: Procedure(s) with comments: ANTERIOR CERVICAL DECOMPRESSION/DISCECTOMY FUSION 2 LEVELS (N/A) - Anterior cervical decompression fusion cervical 5-6, cervical 6-7 with instrumentation and allograft  Patient Location: PACU  Anesthesia Type:General  Level of Consciousness: awake, alert , oriented and patient cooperative  Airway and Oxygen Therapy: Patient Spontanous Breathing  Post-op Pain: moderate  Post-op Assessment: Post-op Vital signs reviewed, Patient's Cardiovascular Status Stable, Respiratory Function Stable, Patent Airway, No signs of Nausea or vomiting and Pain level controlled  Post-op Vital Signs: stable  Last Vitals:  Filed Vitals:   07/02/13 1725  BP:   Pulse: 49  Temp:   Resp: 13    Complications: No apparent anesthesia complications

## 2013-07-02 NOTE — Op Note (Signed)
NAMEda Paschal:  Stopka, Niasia            ACCOUNT NO.:  [id number]  MEDICAL RECORD NO.:  [id number]  LOCATION:  2H25C                        FACILITY:  [facility]  PHYSICIAN:  Estill BambergMark Dumonski, MD      DATE OF BIRTH:  07/18/1960  DATE OF PROCEDURE:  07/02/2013                              OPERATIVE REPORT   PREOPERATIVE DIAGNOSES: 1. Left-sided cervical radiculopathy. 2. C5-6, C6-7 degenerative disk disease with severe left foraminal     stenosis at each level.  POSTOPERATIVE DIAGNOSES: 1. Left-sided cervical radiculopathy. 2. C5-6, C6-7 degenerative disk disease with severe left foraminal     stenosis at each level.  PROCEDURE: 1. C5-6, C6-7 anterior cervical diskectomy and fusion. 2. Placement of anterior instrumentation C5-C7. 3. Insertion of interbody device x2 (Titan interbody spacers). 4. Use of morselized allograft - DBX mix. 5. Intraoperative use of fluoroscopy.  SURGEON:  Estill BambergMark Dumonski, MD.  ASSISTANJason Coop:  Kayla McKenzie, PA-C  ANESTHESIA:  General endotracheal anesthesia.  COMPLICATIONS:  None.  DISPOSITION:  Stable.  ESTIMATED BLOOD LOSS:  Minimal.  INDICATIONS FOR PROCEDURE:  Briefly, Veronica Walker is a very pleasant 53- year-old female who did present to me with ongoing and debilitating pain in her left arm following a work injury.  Of note, the patient's work injury was on January 23, 2012.  I did review a CT myelogram, which clearly was notable for left-sided disk osteophyte complex at C5-6 and C6-7, clearly correlating of the patient's pain.  She did continue to have ongoing pain and dysfunction.  We therefore did discuss proceeding with the procedure reflected above.  The patient did fully understand the risks and limitations of the procedure as outlined in my preoperative note.  OPERATIVE DETAILS:  On July 02, 2013, the patient brought to surgery and general endotracheal anesthesia was administered.  The patient was placed supine on the hospital bed.  The neck was  gently extended and prepped and draped in usual sterile fashion.  The patient's arms were secured to her sides and all bony prominences were meticulously padded. Antibiotics were given and a time-out procedure was performed.  After prepping the neck, left-sided transverse incision was performed.  I did incise the platysma sharply.  The Smith-Robinson approach was utilized in the anterior spine was noted.  Fluoroscopy did confirm the appropriate operative level.  The vertebral bodies are C5-C7 were subperiosteally exposed and the self-retaining retractor was placed. Caspar pins were placed into the C6 and C7 vertebral bodies and gentle distraction was applied.  I then went forward with a thorough and complete diskectomy first using a 15-blade knife followed by a series of curettes.  A high-speed bur was used to remove posterior osteophytes. The posterior longitudinal ligament was identified and entered using a nerve hook.  I then used a #1 Kerrison to remove the posterior longitudinal ligament.  When approaching the left spinal canal and the left neural foramen, there were multiple herniated disk fragments encountered, clearly causing compression of the spinal cord in the exiting C7 nerve.  These were removed uneventfully.  After a thorough decompression was confirmed, I did place a series of trials and I did place a series of trials.  Ultimately, an 8 mm implant was packed  with DBX mix and tamped into position in the usual fashion.  I was pleased with the press fit of the implant.  The Caspar pin from the lower vertebral body was removed and the bone wax was placed in its place.  I then placed a new Caspar pin into the C5 vertebral body.  Distraction was applied across the C5-6 interspace, and again, a diskectomy was performed as previously described.  Once again, I was very pleased with the decompression and I was able to accomplish, there was noted to be severe neural foraminal  stenosis, which was adequately decompressed. Again, the endplates were prepared and again, a series of interbody spacers were placed and I did ultimately elect to pack a 6 mm implant with DBX mix, which was tamped into position in the usual fashion. Osteophytes anteriorly were removed.  I then chose an appropriate sized anterior cervical plate, which was placed over the anterior cervical spine.  14-mm variable angle screws were placed, 2 in each vertebral body from C5-C7 for a total of 6 vertebral body screws.  I was very pleased with the press fit of each of the screws.  The CAM locking mechanism was used to secure the screws to the plate.  I then copiously irrigated the wound.  I did use AP and lateral fluoroscopy while placing the hardware, and I was very pleased with the appearance of the fluoroscopic images.  I explored the wound for any undue bleeding, none was encountered.  I then closed the platysma using 2-0 Vicryl.  The skin was closed using 3-0 Monocryl.  Benzoin and Steri-Strips were applied followed by sterile dressing.  All instrument counts were correct at the termination of the procedure.  Of note, Jason CoopKayla McKenzie was my assistant throughout surgery, and did aid in retraction, suctioning, and closure.     Estill BambergMark Dumonski, MD     MD/MEDQ  D:  07/02/2013  T:  07/02/2013  Job:  [id number]

## 2013-07-02 NOTE — Anesthesia Procedure Notes (Signed)
Procedure Name: Intubation Date/Time: 07/02/2013 2:28 PM Performed by: Jefm MilesENNIE, JULIE E Pre-anesthesia Checklist: Patient identified, Emergency Drugs available, Suction available, Patient being monitored and Timeout performed Patient Re-evaluated:Patient Re-evaluated prior to inductionOxygen Delivery Method: Circle system utilized Preoxygenation: Pre-oxygenation with 100% oxygen Intubation Type: IV induction Ventilation: Mask ventilation without difficulty Laryngoscope Size: Mac and 3 Grade View: Grade I Tube type: Oral Tube size: 7.0 mm Number of attempts: 1 Airway Equipment and Method: Stylet and Video-laryngoscopy Placement Confirmation: ETT inserted through vocal cords under direct vision,  positive ETCO2 and breath sounds checked- equal and bilateral Secured at: 22 cm Tube secured with: Tape Dental Injury: Teeth and Oropharynx as per pre-operative assessment

## 2013-07-03 ENCOUNTER — Encounter (HOSPITAL_COMMUNITY): Payer: Self-pay | Admitting: Emergency Medicine

## 2013-07-03 MED ORDER — DIAZEPAM 5 MG PO TABS: 5.0000 mg | ORAL_TABLET | Freq: Four times a day (QID) | ORAL | Status: AC | PRN

## 2013-07-03 MED ORDER — OXYCODONE-ACETAMINOPHEN 5-325 MG PO TABS: 1.0000 | ORAL_TABLET | ORAL | Status: AC | PRN

## 2013-07-03 NOTE — Progress Notes (Signed)
Ambulated along the hallway with assistance for 10 min  Tolerated well.

## 2013-07-03 NOTE — Progress Notes (Addendum)
Clinical Social Work Department BRIEF PSYCHOSOCIAL ASSESSMENT 07/03/2013  Patient:  Veronica Walker,Veronica Walker     Account Number:  [id number]     Admit date:  07/02/2013  Clinical Social Worker:  Varney BilesANDERSON,JULIE, LCSWA  Date/Time:  07/03/2013 03:12 PM  Referred by:  Physician  Date Referred:  07/03/2013 Referred for  Other - See comment   Other Referral:   Received call from pt's workers comp company   Interview type:  Patient Other interview type:    PSYCHOSOCIAL DATA Living Status:  FAMILY Admitted from facility:   Level of care:   Primary support name:  Guido SanderJohnny Gersten [phone]) Primary support relationship to patient:  SPOUSE Degree of support available:   Good--pt lives with spouse.    CURRENT CONCERNS Current Concerns  Other - See comment   Other Concerns:   Workers comp paperwork    SOCIAL WORK ASSESSMENT / PLAN CSW received call from pt's worker's comp case worker Adele SchilderLisa Carnes (phone: [phone], fax: [phone]) with [employer]. CSW following for discharge summary and will send clinicals to [place]isa. Explained Lisa's request to pt, and she expressed understanding and thanked CSW for assistance. CSW will sign off after sending clinicals to pt's case worker.   Assessment/plan status:  Psychosocial Support/Ongoing Assessment of Needs Other assessment/ plan:   Information/referral to community resources:   Clinicals to pt's case worker with [employer]    PATIENT'S/FAMILY'S RESPONSE TO PLAN OF CARE: Good--pt thanked CSW for assisting, and CSW will send discharge summary and progress notes to case worker once discharge summary is complete.       Maryclare LabradorJulie Anderson, MSW, [facility] HospitalCSWA Clinical Social Worker [phone]

## 2013-07-03 NOTE — Progress Notes (Signed)
Utilization review completed.  

## 2013-07-03 NOTE — Progress Notes (Signed)
    Patient doing well Arm pain resolved + nausea - improving   Physical Exam: Filed Vitals:   07/03/13 0752  BP:   Pulse:   Temp: 98.3 F (36.8 C)  Resp: 13    Dressing in place NVI  POD #1 s/p 2 level acdf  - encourage ambulation - Percocet for pain, Valium for muscle spasms - likely d/c home today

## 2013-07-03 NOTE — Progress Notes (Signed)
Orthopedic Tech Progress Note Patient Details:  Veronica PaschalChristy Walker June 21, 1960 [id number] Delivered Ortho Devices Type of Ortho Device: [place] cervical collar Ortho Device/Splint Interventions: Ordered   [place] R Thompson 07/03/2013, 9:23 AM

## 2013-07-03 NOTE — Progress Notes (Signed)
Discharged home accompanied by spouse and brother, cervical collar in used, discharge instructions, prescription and  [place] cervical collar  given to pt. Belongings taken home.

## 2013-07-03 NOTE — Progress Notes (Signed)
Assisted to the bathroom voided freely .

## 2013-07-03 NOTE — Progress Notes (Signed)
[place] cervical collar provided by ortho. tech and explained to her and brother the use of it.

## 2013-07-03 NOTE — Progress Notes (Signed)
Claimed that she feels sleepy and refused to have her foley cath removed at this time since she got her lasix this am.

## 2013-07-03 NOTE — Progress Notes (Signed)
A-Line d/ced on right wrist tolerated well, catheter intact, no bleeding noted.

## 2013-07-10 NOTE — Discharge Summary (Signed)
Patient ID: Veronica PaschalChristy Mathies MRN: [id number] DOB/AGE: 01-12-1960 53 y.o.  Admit date: 07/02/2013 Discharge date: 07/03/2013  Admission Diagnoses:  Active Problems:   Radiculopathy   Discharge Diagnoses:  Same  Past Medical History  Diagnosis Date  . Hyperlipidemia     takes Atorvastatin daily  . Muscle spasm     takes Flexeril daily  . Hypertension   . Vasculitis     takes Lisinopril and Metoprolol daily  . Abdominal swelling     takes Furosemide daily  . MVP (mitral valve prolapse)   . Shortness of breath     with exertion  . Pneumonia 7073yrs ago  . History of bronchitis   . TIA (transient ischemic attack) 2010  . Arthritis   . Joint pain   . Hemorrhoids   . Constipation     takes Colace daily  . Insomnia     d/t pain and unknown other reasons  . History of MRSA infection 2012  . CHF (congestive heart failure)     chronic systolic CHF  . Pacemaker     atrial cardiac pacemaker; s/p [employer] ICD 01/05/12  . UTI (lower urinary tract infection)     started Cipro 500mg  started Sun, June 21    Surgeries: Procedure(s): ANTERIOR CERVICAL DECOMPRESSION/DISCECTOMY FUSION 2 LEVELS C5-7 on 07/02/2013   Consultants:    Discharged Condition: Improved  Hospital Course: Veronica PaschalChristy Walker is an 53 y.o. female who was admitted 07/02/2013 for operative treatment of radiculopathy. Patient has severe unremitting pain that affects sleep, daily activities, and work/hobbies. After pre-op clearance the patient was taken to the operating room on 07/02/2013 and underwent  Procedure(s): ANTERIOR CERVICAL DECOMPRESSION/DISCECTOMY FUSION 2 LEVELS C5-7.    Patient was given perioperative antibiotics:  Anti-infectives   Start     Dose/Rate Route Frequency Ordered Stop   07/02/13 2230  ceFAZolin (ANCEF) IVPB 1 g/50 mL premix     1 g 100 mL/hr over 30 Minutes Intravenous Every 8 hours 07/02/13 1842 07/03/13 0630   07/02/13 0600  ceFAZolin (ANCEF) IVPB 2 g/50 mL premix     2  g 100 mL/hr over 30 Minutes Intravenous On call to O.R. 07/01/13 1447 07/02/13 1430       Patient was given sequential compression devices, early ambulation to prevent DVT.  Patient benefited maximally from hospital stay and there were no complications.    Recent vital signs: BP 93/47  Pulse 59  Temp(Src) 98.3 F (36.8 C) (Oral)  Resp 14  Ht 5\' 5"  (1.651 m)  Wt 91.9 kg (202 lb 9.6 oz)  BMI 33.71 kg/m2  SpO2 94%   Discharge Medications:     Medication List    STOP taking these medications       aspirin EC 81 MG tablet     azaTHIOprine 50 MG tablet  Commonly known as:  IMURAN     cyclobenzaprine 5 MG tablet  Commonly known as:  FLEXERIL     Fish Oil 1000 MG Caps      TAKE these medications       atorvastatin 40 MG tablet  Commonly known as:  LIPITOR  Take 40 mg by mouth daily.     CALCIUM-VITAMIN D PO  Take 1 tablet by mouth daily.     diazepam 5 MG tablet  Commonly known as:  VALIUM  Take 1 tablet (5 mg total) by mouth every 6 (six) hours as needed for muscle spasms.     docusate sodium 100 MG capsule  Commonly known as:  COLACE  Take 100 mg by mouth daily.     furosemide 40 MG tablet  Commonly known as:  LASIX  Take 40 mg by mouth daily.     lisinopril 2.5 MG tablet  Commonly known as:  PRINIVIL,ZESTRIL  Take 5 mg by mouth 2 (two) times daily.     LORazepam 0.5 MG tablet  Commonly known as:  ATIVAN  Take 0.5 mg by mouth every 8 (eight) hours as needed for anxiety or sleep.     metoprolol succinate 25 MG 24 hr tablet  Commonly known as:  TOPROL-XL  Take 25-50 mg by mouth 2 (two) times daily. Take 50mg  in the morning and take 25mg  at bedtime.     oxyCODONE-acetaminophen 5-325 MG per tablet  Commonly known as:  PERCOCET/ROXICET  Take 1-2 tablets by mouth every 4 (four) hours as needed for moderate pain.     potassium chloride 10 MEQ tablet  Commonly known as:  K-DUR,KLOR-CON  Take 10 mEq by mouth daily.        Diagnostic Studies: Dg Chest 2  View  06/27/2013   CLINICAL DATA:  53 year old female preoperative study for cervical spine surgery. Recent cough. Prior heart surgery. Initial encounter.  EXAM: CHEST  2 VIEW  COMPARISON:  Chest radiographs 04/12/2005.  Chest CT 04/14/2005.  FINDINGS: Interval sequelae of median sternotomy, cardiac valve replacement, and left chest cardiac AICD placement. Stable cardiac size and mediastinal contours. Small right cardiophrenic angle pericardial cyst incidentally re- identified. Visualized tracheal air column is within normal limits. No pneumothorax, pulmonary edema, pleural effusion or acute pulmonary opacity. No acute osseous abnormality identified.  IMPRESSION: No acute cardiopulmonary abnormality.   Electronically Signed   By: Augusto GambleLee  Hall M.D.   On: 06/27/2013 11:07   Dg Cervical Spine 2-3 Views  07/02/2013   CLINICAL DATA:  ACDF  EXAM: CERVICAL SPINE - 2-3 VIEW  COMPARISON:  Cervical spine CT - 02/19/2013.  FLUOROSCOPY TIME:  23 seconds  FINDINGS: Two spot intraoperative radiographic images of the cervical spine are provided for review.  Images demonstrate the sequela of C5-C7 ACDF and intervertebral disc space replacement, though note, the caudal aspect of the hardware is suboptimally evaluated secondary to overlying osseous and soft tissue structures.  An endotracheal tube overlies the tracheal air column with tip excluded from view. Radiopaque surgical support apparatus is seen anterior to the operative site. No radiopaque foreign body.  IMPRESSION: Post C5-C7 ACDF.   Electronically Signed   By: Simonne ComeJohn  Watts M.D.   On: 07/02/2013 17:19   Dg C-arm 61-120 Min  07/03/2013   CLINICAL DATA:  Cervical disc protrusions.  FLUOROSCOPY TIME:  0 min 23 seconds  C-arm fluoroscopic images were obtained intraoperatively and submitted for post operative interpretation. Please see the performing provider's procedural report for the fluoroscopy time utilized.   Electronically Signed   By: Geanie CooleyJim  Maxwell M.D.   On:  07/03/2013 15:27    Disposition: 01-Home or Self Care      Discharge Instructions   Discharge patient    Complete by:  As directed      Discharge patient    Complete by:  As directed           PO Day 1 S/P C5-7 ACDF -Written scripts for pain signed and in chart -D/C instructions sheet printed and in chart -D/C today  -F/U in office 2 weeks   Signed: Georga BoraMCKENZIE, Adayah Arocho J 07/10/2013, 7:16 AM

## 2018-08-26 ENCOUNTER — Other Ambulatory Visit (HOSPITAL_COMMUNITY)
Admission: RE | Admit: 2018-08-26 | Discharge: 2018-08-26 | Disposition: A | Discharge status: Home / Self Care | Payer: Managed Care, Other (non HMO) | Source: Other Acute Inpatient Hospital | Attending: Acute Care | Admitting: Acute Care

## 2018-08-26 DIAGNOSIS — Z95811 Presence of heart assist device: Secondary | ICD-10-CM | POA: Insufficient documentation

## 2018-08-26 DIAGNOSIS — T827XXA Infection and inflammatory reaction due to other cardiac and vascular devices, implants and grafts, initial encounter: Secondary | ICD-10-CM | POA: Diagnosis not present

## 2018-08-26 LAB — BASIC METABOLIC PANEL
Anion gap: 15 (ref 5–15)
BUN: 47 mg/dL — ABNORMAL HIGH (ref 6–20)
CO2: 22 mmol/L (ref 22–32)
Calcium: 9.5 mg/dL (ref 8.9–10.3)
Chloride: 103 mmol/L (ref 98–111)
Creatinine, Ser: 1.66 mg/dL — ABNORMAL HIGH (ref 0.44–1.00)
GFR calc Af Amer: 40 mL/min — ABNORMAL LOW (ref >60)
GFR calc non Af Amer: 34 mL/min — ABNORMAL LOW (ref >60)
Glucose, Bld: 93 mg/dL (ref 70–99)
Potassium: 4.4 mmol/L (ref 3.5–5.1)
Sodium: 140 mmol/L (ref 135–145)

## 2018-08-26 LAB — CBC WITH DIFFERENTIAL/PLATELET
Abs Immature Granulocytes: 0.02 10*3/uL (ref 0.00–0.07)
Basophils Absolute: 0 10*3/uL (ref 0.0–0.1)
Basophils Relative: 0 %
Eosinophils Absolute: 0.2 10*3/uL (ref 0.0–0.5)
Eosinophils Relative: 3 %
HCT: 39.6 % (ref 36.0–46.0)
Hemoglobin: 11.8 g/dL — ABNORMAL LOW (ref 12.0–15.0)
Immature Granulocytes: 0 %
Lymphocytes Relative: 16 %
Lymphs Abs: 1.2 10*3/uL (ref 0.7–4.0)
MCH: 24.5 pg — ABNORMAL LOW (ref 26.0–34.0)
MCHC: 29.8 g/dL — ABNORMAL LOW (ref 30.0–36.0)
MCV: 82.3 fL (ref 80.0–100.0)
Monocytes Absolute: 0.7 10*3/uL (ref 0.1–1.0)
Monocytes Relative: 8 %
Neutro Abs: 5.7 10*3/uL (ref 1.7–7.7)
Neutrophils Relative %: 73 %
Platelets: 196 10*3/uL (ref 150–400)
RBC: 4.81 MIL/uL (ref 3.87–5.11)
RDW: 19.6 % — ABNORMAL HIGH (ref 11.5–15.5)
WBC: 7.8 10*3/uL (ref 4.0–10.5)
nRBC: 0 % (ref 0.0–0.2)

## 2018-08-26 LAB — PROTIME-INR
INR: 1.6 — ABNORMAL HIGH (ref 0.8–1.2)
Prothrombin Time: 18.5 seconds — ABNORMAL HIGH (ref 11.4–15.2)

## 2018-08-30 ENCOUNTER — Other Ambulatory Visit (HOSPITAL_COMMUNITY)
Admission: RE | Admit: 2018-08-30 | Discharge: 2018-08-30 | Disposition: A | Discharge status: Home / Self Care | Payer: Managed Care, Other (non HMO) | Source: Other Acute Inpatient Hospital | Attending: General Surgery | Admitting: General Surgery

## 2018-08-30 DIAGNOSIS — T827XXA Infection and inflammatory reaction due to other cardiac and vascular devices, implants and grafts, initial encounter: Secondary | ICD-10-CM | POA: Diagnosis present

## 2018-08-30 LAB — PROTIME-INR
INR: 2.4 — ABNORMAL HIGH (ref 0.8–1.2)
Prothrombin Time: 25.8 seconds — ABNORMAL HIGH (ref 11.4–15.2)

## 2018-09-06 ENCOUNTER — Other Ambulatory Visit (HOSPITAL_COMMUNITY)
Admission: RE | Admit: 2018-09-06 | Discharge: 2018-09-06 | Disposition: A | Discharge status: Home / Self Care | Payer: Managed Care, Other (non HMO) | Source: Other Acute Inpatient Hospital | Attending: Internal Medicine | Admitting: Internal Medicine

## 2018-09-06 DIAGNOSIS — Z95811 Presence of heart assist device: Secondary | ICD-10-CM | POA: Insufficient documentation

## 2018-09-06 DIAGNOSIS — T827XXA Infection and inflammatory reaction due to other cardiac and vascular devices, implants and grafts, initial encounter: Secondary | ICD-10-CM | POA: Diagnosis not present

## 2018-09-06 LAB — PROTIME-INR
INR: 2.7 — ABNORMAL HIGH (ref 0.8–1.2)
Prothrombin Time: 28.3 seconds — ABNORMAL HIGH (ref 11.4–15.2)

## 2018-10-09 ENCOUNTER — Other Ambulatory Visit (HOSPITAL_COMMUNITY)
Admission: RE | Admit: 2018-10-09 | Discharge: 2018-10-09 | Disposition: A | Discharge status: Home / Self Care | Payer: Managed Care, Other (non HMO) | Source: Other Acute Inpatient Hospital | Attending: Acute Care | Admitting: Acute Care

## 2018-10-09 DIAGNOSIS — Z95811 Presence of heart assist device: Secondary | ICD-10-CM | POA: Insufficient documentation

## 2018-10-09 DIAGNOSIS — T827XXA Infection and inflammatory reaction due to other cardiac and vascular devices, implants and grafts, initial encounter: Secondary | ICD-10-CM | POA: Diagnosis not present

## 2018-10-09 LAB — PROTIME-INR
INR: 2.2 — ABNORMAL HIGH (ref 0.8–1.2)
Prothrombin Time: 24.3 s — ABNORMAL HIGH (ref 11.4–15.2)

## 2020-03-27 ENCOUNTER — Encounter (HOSPITAL_BASED_OUTPATIENT_CLINIC_OR_DEPARTMENT_OTHER): Payer: Self-pay | Admitting: *Deleted

## 2020-03-27 ENCOUNTER — Other Ambulatory Visit: Payer: Self-pay

## 2020-03-27 ENCOUNTER — Emergency Department (HOSPITAL_BASED_OUTPATIENT_CLINIC_OR_DEPARTMENT_OTHER): Payer: Medicare Other

## 2020-03-27 ENCOUNTER — Emergency Department (HOSPITAL_BASED_OUTPATIENT_CLINIC_OR_DEPARTMENT_OTHER)
Admission: EM | Admit: 2020-03-27 | Discharge: 2020-03-27 | Disposition: A | Discharge status: Home / Self Care | Payer: Medicare Other | Attending: Emergency Medicine | Admitting: Emergency Medicine

## 2020-03-27 DIAGNOSIS — Z79899 Other long term (current) drug therapy: Secondary | ICD-10-CM | POA: Diagnosis not present

## 2020-03-27 DIAGNOSIS — I110 Hypertensive heart disease with heart failure: Secondary | ICD-10-CM | POA: Diagnosis not present

## 2020-03-27 DIAGNOSIS — W010XXA Fall on same level from slipping, tripping and stumbling without subsequent striking against object, initial encounter: Secondary | ICD-10-CM | POA: Diagnosis not present

## 2020-03-27 DIAGNOSIS — Z950 Presence of cardiac pacemaker: Secondary | ICD-10-CM | POA: Diagnosis not present

## 2020-03-27 DIAGNOSIS — I509 Heart failure, unspecified: Secondary | ICD-10-CM | POA: Insufficient documentation

## 2020-03-27 DIAGNOSIS — S99911A Unspecified injury of right ankle, initial encounter: Secondary | ICD-10-CM | POA: Diagnosis present

## 2020-03-27 DIAGNOSIS — X501XXA Overexertion from prolonged static or awkward postures, initial encounter: Secondary | ICD-10-CM | POA: Insufficient documentation

## 2020-03-27 DIAGNOSIS — S92354A Nondisplaced fracture of fifth metatarsal bone, right foot, initial encounter for closed fracture: Secondary | ICD-10-CM

## 2020-03-27 MED ORDER — OXYCODONE-ACETAMINOPHEN 5-325 MG PO TABS: 1.0000 | ORAL_TABLET | Freq: Once | ORAL | Status: DC

## 2020-03-27 MED ORDER — ONDANSETRON 4 MG PO TBDP
4.0000 mg | ORAL_TABLET | Freq: Once | ORAL | Status: AC
  Administered 2020-03-27: 4 mg via ORAL
  Filled 2020-03-27: qty 1

## 2020-03-27 MED ORDER — ACETAMINOPHEN 500 MG PO TABS: 1000.0000 mg | ORAL_TABLET | Freq: Once | ORAL | Status: DC

## 2020-03-27 NOTE — Discharge Instructions (Signed)
Please read instructions below. Keep the splint clean, dry and in place at all times.  Use your walker to avoid bearing weight on your right foot until cleared by orthopedics. Elevate your foot as much as possible. You can take Tylenol every 6 hours as needed for pain. Call the orthopedic specialist office the next business day to schedule an appointment for repeat xray and follow up on your injury. Return to the ED for concerning symptoms.

## 2020-03-27 NOTE — ED Triage Notes (Signed)
Pt reports hx of neuropathy. States she fell today and her right foot rolled under her. Pt has LVAD

## 2020-03-27 NOTE — ED Provider Notes (Signed)
[facility] EMERGENCY DEPARTMENT Provider Note   CSN: [id number] Arrival date & time: 03/27/20  1842     History Chief Complaint  Patient presents with  . Fall    Right foot pain    Veronica Walker is a 60 y.o. female with LVAD, CHF, TIA, HTN, presenting the emergency department for evaluation of right ankle/foot injury that occurred today.  She states she tripped and fell on her right foot rolled under her.  She is having most of her pain to the lateral aspect of the foot and ankle.  Pain is worse with any palpation and with movement.  She reports chronic neuropathy with distal numbness to the foot, this is unchanged. Denies hitting her head or any other injuries.  Not on anticoagulation.  Reports no complaints regarding her LVAD.   The history is provided by the patient.       Past Medical History:  Diagnosis Date  . Abdominal swelling    takes Furosemide daily  . Arthritis   . CHF (congestive heart failure) (HCC)    chronic systolic CHF  . Constipation    takes Colace daily  . Hemorrhoids   . History of bronchitis   . History of MRSA infection 2012  . Hyperlipidemia    takes Atorvastatin daily  . Hypertension   . Insomnia    d/t pain and unknown other reasons  . Joint pain   . Muscle spasm    takes Flexeril daily  . MVP (mitral valve prolapse)   . Pacemaker    atrial cardiac pacemaker; s/p [employer] ICD 01/05/12  . Pneumonia 69yrs ago  . Shortness of breath    with exertion  . TIA (transient ischemic attack) 2010  . UTI (lower urinary tract infection)    started Cipro 500mg  started Sun, June 21  . Vasculitis (HCC)    takes Lisinopril and Metoprolol daily    Patient Active Problem List   Diagnosis Date Noted  . Radiculopathy 07/02/2013    Past Surgical History:  Procedure Laterality Date  . ANKLE SURGERY Bilateral   . ANTERIOR CERVICAL DECOMP/DISCECTOMY FUSION N/A 07/02/2013   Procedure: ANTERIOR CERVICAL DECOMPRESSION/DISCECTOMY  FUSION 2 LEVELS;  Surgeon: 07/04/2013, MD;  Location: [facility];  Service: Orthopedics;  Laterality: N/A;  Anterior cervical decompression fusion cervical 5-6, cervical 6-7 with instrumentation and allograft  . ATRIAL CARDIAC PACEMAKER INSERTION    . BLADDER SUSPENSION    . CARDIAC CATHETERIZATION  2015  . CARDIAC DEFIBRILLATOR PLACEMENT     2016   . COLONOSCOPY    . I&D to right abdomen     d/t MRSA  . LEFT VENTRICULAR ASSIST DEVICE    . Mitral and Aortic Valve Replaced  2010/2012   done at [facility]: [facility]  . partial hysterecomy       OB History   No obstetric history on file.     No family history on file.  Social History   Tobacco Use  . Smoking status: Never Smoker  . Smokeless tobacco: Never Used  Vaping Use  . Vaping Use: Never used  Substance Use Topics  . Alcohol use: Not Currently    Comment: 2-3 times a yr wine  . Drug use: Yes    Types: Marijuana    Comment: medical    Home Medications Prior to Admission medications   Medication Sig Start Date End Date Taking? Authorizing Provider  atorvastatin (LIPITOR) 40 MG tablet Take 40 mg by mouth daily.  [provider]  CALCIUM-VITAMIN D PO Take 1 tablet by mouth daily.    [provider]  diazepam (VALIUM) 5 MG tablet Take 1 tablet (5 mg total) by mouth every 6 (six) hours as needed for muscle spasms. 07/03/13   Estill Bamberg, MD  docusate sodium (COLACE) 100 MG capsule Take 100 mg by mouth daily.    [provider]  furosemide (LASIX) 40 MG tablet Take 40 mg by mouth daily.    [provider]  lisinopril (PRINIVIL,ZESTRIL) 2.5 MG tablet Take 5 mg by mouth 2 (two) times daily.    [provider]  LORazepam (ATIVAN) 0.5 MG tablet Take 0.5 mg by mouth every 8 (eight) hours as needed for anxiety or sleep.    [provider]  metoprolol succinate (TOPROL-XL) 25 MG 24 hr tablet Take 25-50 mg by mouth 2 (two) times daily. Take 50mg  in the morning and take  25mg  at bedtime.    [provider]  oxyCODONE-acetaminophen (PERCOCET/ROXICET) 5-325 MG per tablet Take 1-2 tablets by mouth every 4 (four) hours as needed for moderate pain. 07/03/13   , MD  potassium chloride (K-DUR,KLOR-CON) 10 MEQ tablet Take 10 mEq by mouth daily.    [provider]    Allergies    Contrast media [iodinated diagnostic agents], Morphine and related, and Other  Review of Systems   Review of Systems  Musculoskeletal: Positive for arthralgias.  Skin: Negative for wound.  All other systems reviewed and are negative.   Physical Exam Updated Vital Signs BP 100/66 (BP Location: Right Arm)   Pulse 90   Temp 98.1 F (36.7 C) (Oral)   Resp (!) 26   Ht 5\' 5"  (1.651 m)   Wt 71.2 kg   SpO2 97%   BMI 26.13 kg/m   Physical Exam Vitals and nursing note reviewed.  Constitutional:      General: She is not in acute distress.    Appearance: She is well-developed.  HENT:     Head: Normocephalic and atraumatic.  Eyes:     Conjunctiva/sclera: Conjunctivae normal.  Cardiovascular:     Rate and Rhythm: Normal rate.  Pulmonary:     Effort: Pulmonary effort is normal.  Abdominal:     Palpations: Abdomen is soft.  Musculoskeletal:     Comments: Right lateral ankle with mild swelling noted. TTP across anterior and lateral aspect on the ankle. Patient is holding ankle in a medially rotated position. Achilles is intact. Strong DP pulse. No wounds.   Skin:    General: Skin is warm.  Neurological:     Mental Status: She is alert.  Psychiatric:        Behavior: Behavior normal.     ED Results / Procedures / Treatments   Labs (all labs ordered are listed, but only abnormal results are displayed) Labs Reviewed - No data to display  EKG None  Radiology DG Ankle Complete Right  Result Date: 03/27/2020 CLINICAL DATA:  Fall EXAM: RIGHT ANKLE - COMPLETE 3+ VIEW COMPARISON:  None. FINDINGS: There is a fracture at the base of the right 5th  metatarsal, better seen on foot series today. No acute bony abnormality at the right ankle. No subluxation or dislocation. Plantar calcaneal spur. IMPRESSION: Fracture at the base of the right 5th metatarsal. Electronically Signed   By: Estill Bamberg M.D.   On: 03/27/2020 19:56   DG Foot Complete Right  Result Date: 03/27/2020 CLINICAL DATA:  Fall EXAM: RIGHT FOOT COMPLETE - 3+  VIEW COMPARISON:  Ankle series today FINDINGS: Fracture noted at the base of the right 5th metatarsal. Overlying soft tissue swelling. No subluxation or dislocation. Plantar calcaneal spur. IMPRESSION: Fracture at the base of the right 5th metatarsal. Electronically Signed   By: Charlett Nose M.D.   On: 03/27/2020 19:57    Procedures Procedures   Medications Ordered in ED Medications  acetaminophen (TYLENOL) tablet 1,000 mg (1,000 mg Oral Patient Refused/Not Given 03/27/20 1950)  ondansetron (ZOFRAN-ODT) disintegrating tablet 4 mg (4 mg Oral Given 03/27/20 1950)    ED Course  I have reviewed the triage vital signs and the nursing notes.  Pertinent labs & imaging results that were available during my care of the patient were reviewed by me and considered in my medical decision making (see chart for details).    MDM Rules/Calculators/A&P                          Patient with nondisplaced fracture noted at the base of the right fifth metatarsal after mechanical fall today where she fell with her foot tucked under her.  She is having pain to the lateral aspect of the ankle and foot which is consistent with patient's injury.  Also discussed possibility of sprain.  She declined any pain medication at this time, states she can treat with Tylenol at home.  Regarding patient's LVAD, she has no current complaints.    Patient is placed in a cam walker boot.  She has a walker at home, encourage she remain nonweightbearing and follow closely with orthopedist.  RICE therapy, Tylenol for pain.  Patient is agreement with plan and no  distress upon discharge  Discussed results, findings, treatment and follow up. Patient advised of return precautions. Patient verbalized understanding and agreed with plan.  Final Clinical Impression(s) / ED Diagnoses Final diagnoses:  Closed nondisplaced fracture of fifth metatarsal bone of right foot, initial encounter  Injury of right ankle, initial encounter    Rx / DC Orders ED Discharge Orders    None       Robinson, [place] N, PA-C 03/27/20 2034    Gwyneth Sprout, MD 03/27/20 [phone]

## 2021-11-12 IMAGING — DX DG FOOT COMPLETE 3+V*R*
3 series · 3 of 3 positions shown · non-contrast
Comparison: Ankle series today

CLINICAL DATA: Fall

EXAM:
RIGHT FOOT COMPLETE - 3+ VIEW

[foot ap]
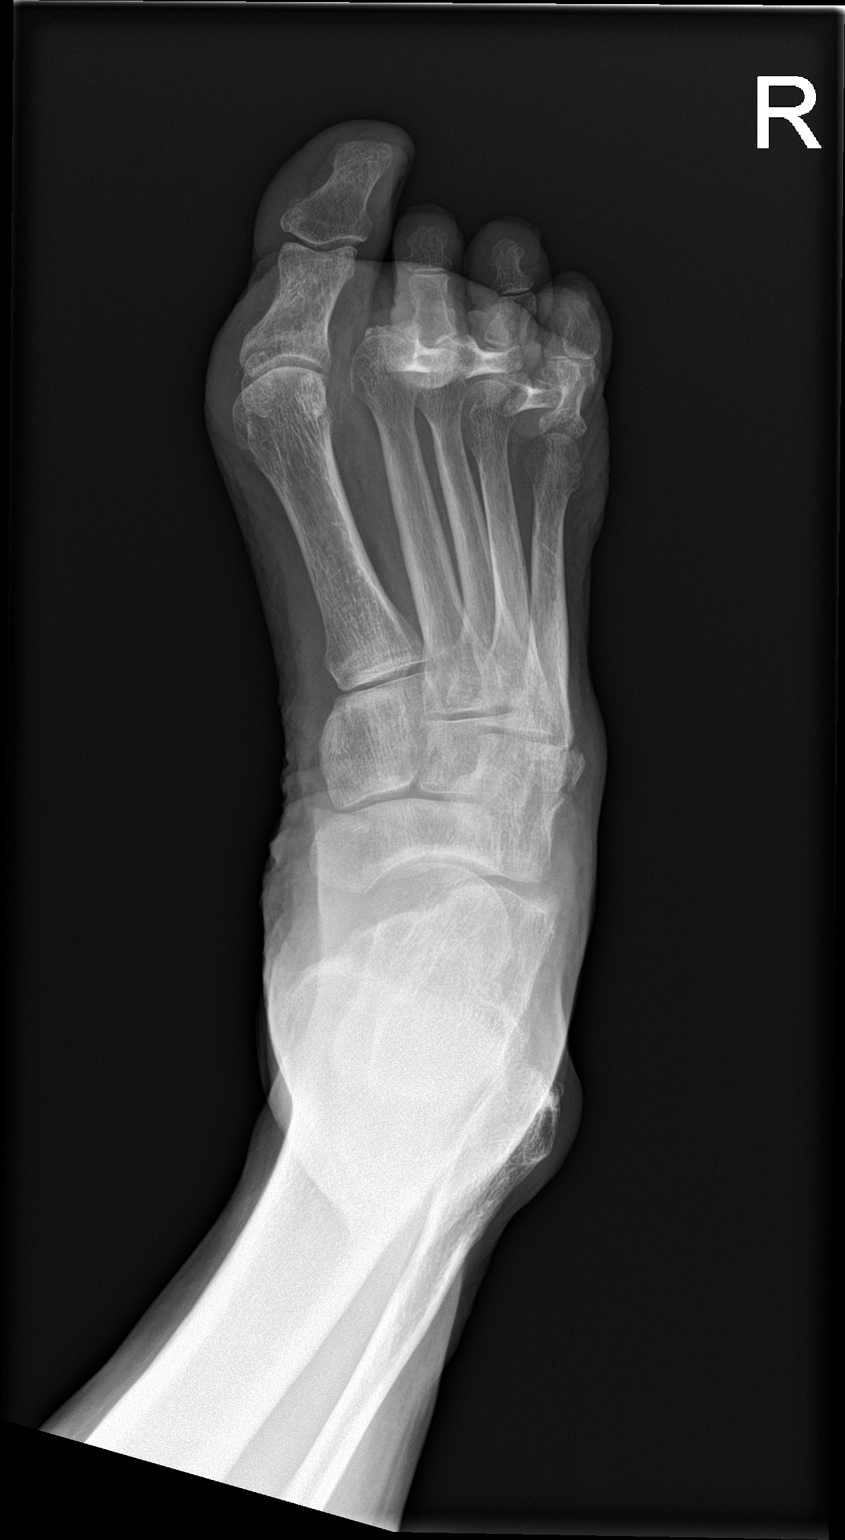

[foot obl]
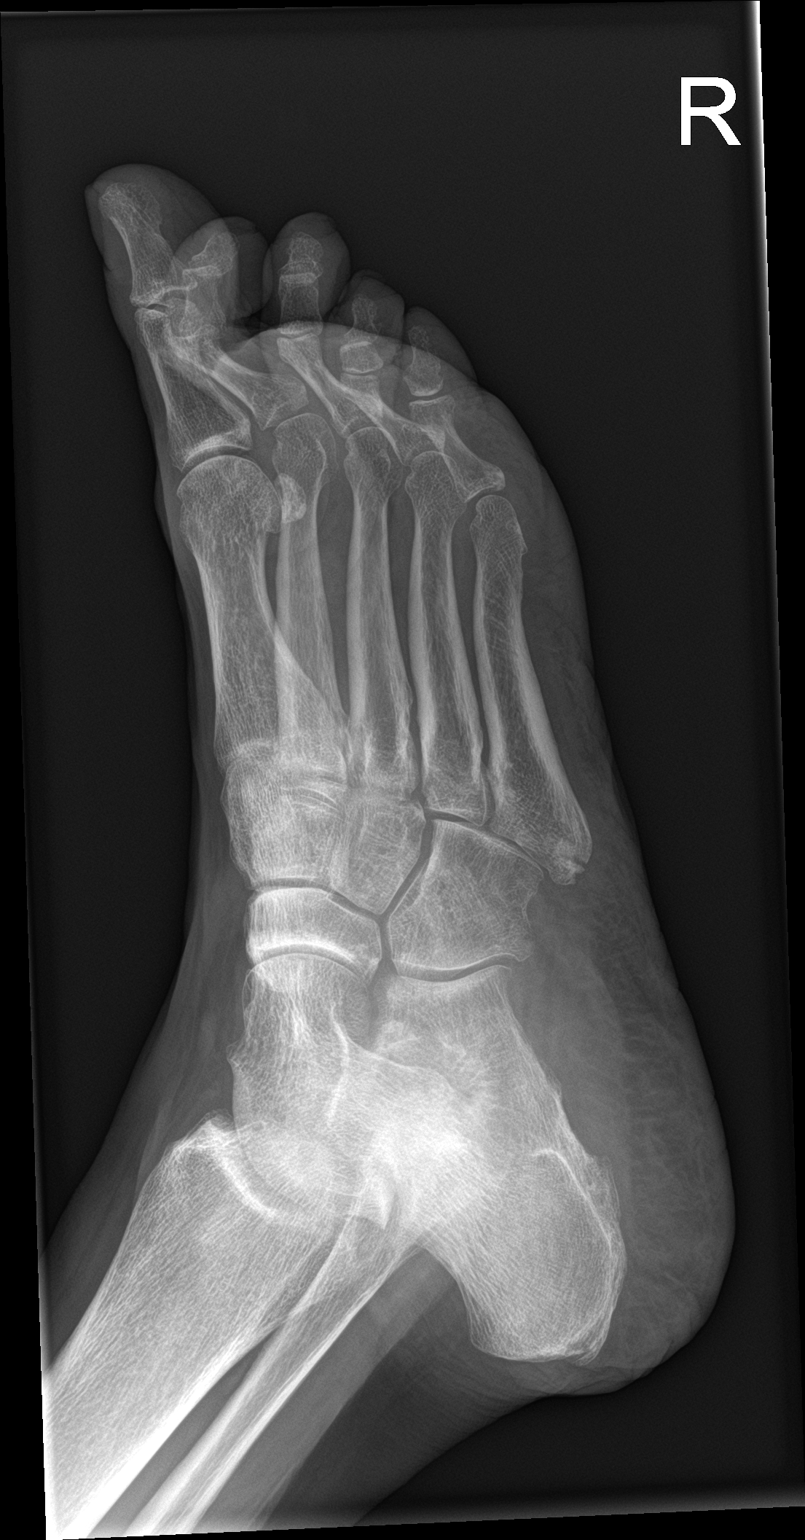

[foot lat]
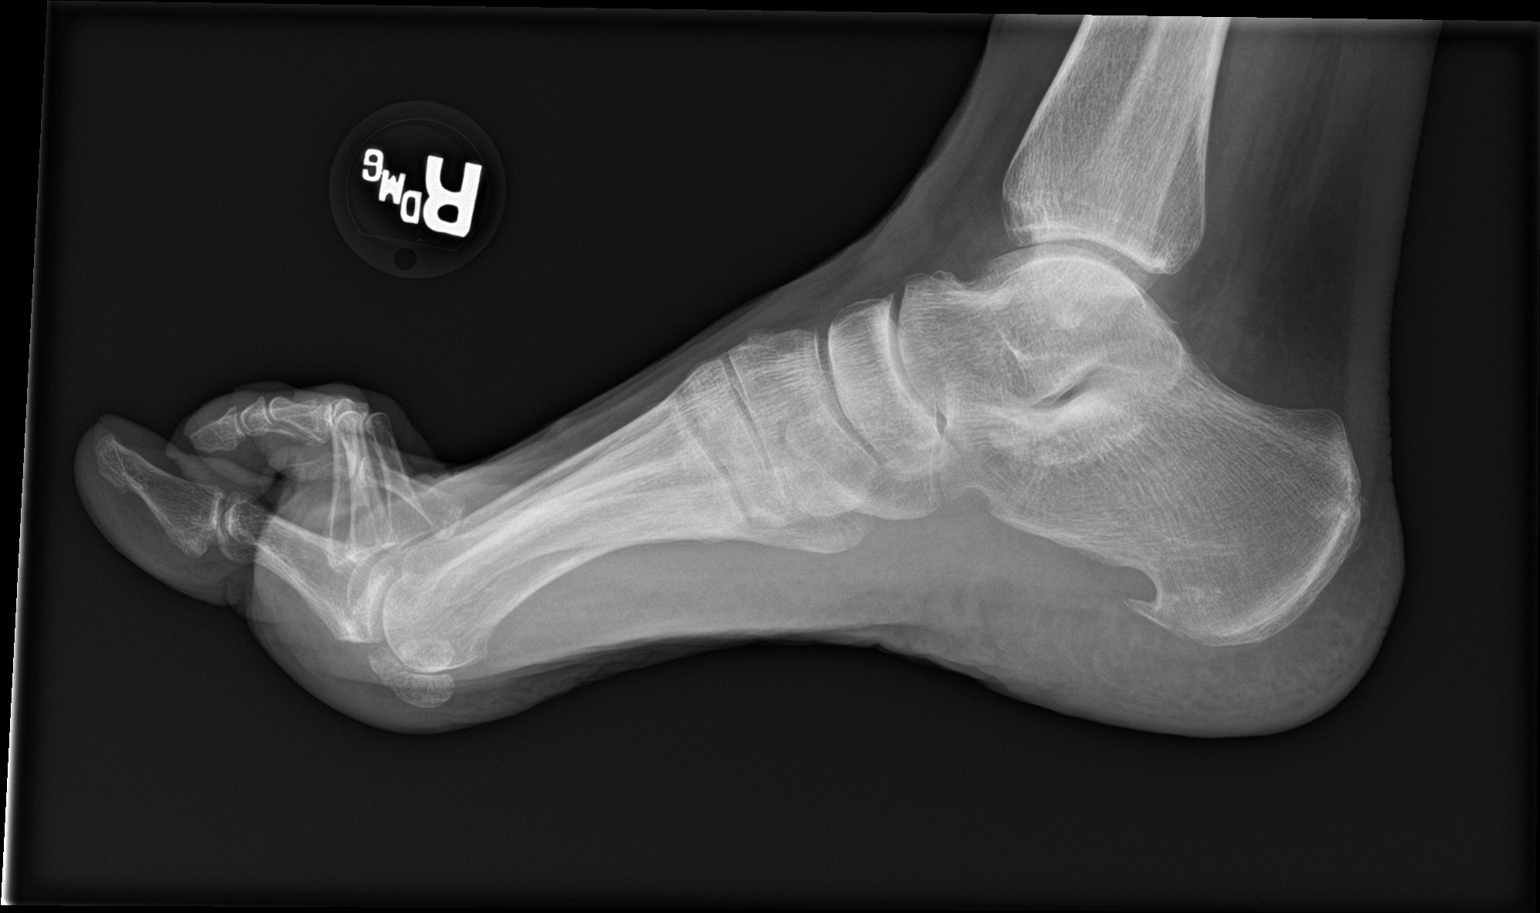

[3 of 3 positions shown; findings below may reference images not displayed]

FINDINGS: Fracture noted at the base of the right 5th metatarsal. Overlying
soft tissue swelling. No subluxation or dislocation. Plantar
calcaneal spur.
IMPRESSION: Fracture at the base of the right 5th metatarsal.

## 2022-07-30 ENCOUNTER — Other Ambulatory Visit: Payer: Self-pay

## 2022-07-30 ENCOUNTER — Encounter (HOSPITAL_BASED_OUTPATIENT_CLINIC_OR_DEPARTMENT_OTHER): Payer: Self-pay | Admitting: Urology

## 2022-07-30 ENCOUNTER — Emergency Department (HOSPITAL_BASED_OUTPATIENT_CLINIC_OR_DEPARTMENT_OTHER): Payer: Medicare Other

## 2022-07-30 ENCOUNTER — Emergency Department (HOSPITAL_BASED_OUTPATIENT_CLINIC_OR_DEPARTMENT_OTHER)
Admission: EM | Admit: 2022-07-30 | Discharge: 2022-07-30 | Disposition: A | Discharge status: Home / Self Care | Payer: Medicare Other | Source: Home / Self Care | Attending: Emergency Medicine | Admitting: Emergency Medicine

## 2022-07-30 DIAGNOSIS — Z20822 Contact with and (suspected) exposure to covid-19: Secondary | ICD-10-CM | POA: Insufficient documentation

## 2022-07-30 DIAGNOSIS — W228XXA Striking against or struck by other objects, initial encounter: Secondary | ICD-10-CM | POA: Insufficient documentation

## 2022-07-30 DIAGNOSIS — S99912A Unspecified injury of left ankle, initial encounter: Secondary | ICD-10-CM | POA: Diagnosis present

## 2022-07-30 DIAGNOSIS — S8012XA Contusion of left lower leg, initial encounter: Secondary | ICD-10-CM | POA: Insufficient documentation

## 2022-07-30 DIAGNOSIS — I509 Heart failure, unspecified: Secondary | ICD-10-CM | POA: Insufficient documentation

## 2022-07-30 DIAGNOSIS — Z79899 Other long term (current) drug therapy: Secondary | ICD-10-CM | POA: Diagnosis not present

## 2022-07-30 DIAGNOSIS — J069 Acute upper respiratory infection, unspecified: Secondary | ICD-10-CM | POA: Diagnosis not present

## 2022-07-30 DIAGNOSIS — I110 Hypertensive heart disease with heart failure: Secondary | ICD-10-CM | POA: Diagnosis not present

## 2022-07-30 DIAGNOSIS — M25572 Pain in left ankle and joints of left foot: Secondary | ICD-10-CM

## 2022-07-30 LAB — GROUP A STREP BY PCR: Group A Strep by PCR: NOT DETECTED

## 2022-07-30 LAB — RESP PANEL BY RT-PCR (RSV, FLU A&B, COVID)  RVPGX2
Influenza A by PCR: NEGATIVE
Influenza B by PCR: NEGATIVE
Resp Syncytial Virus by PCR: NEGATIVE
SARS Coronavirus 2 by RT PCR: NEGATIVE

## 2022-07-30 LAB — BASIC METABOLIC PANEL
Anion gap: 8 (ref 5–15)
BUN: 26 mg/dL — ABNORMAL HIGH (ref 6–20)
CO2: 27 mmol/L (ref 22–32)
Calcium: 9.3 mg/dL (ref 8.9–10.3)
Chloride: 103 mmol/L (ref 98–111)
Creatinine, Ser: 1.23 mg/dL — ABNORMAL HIGH (ref 0.44–1.00)
GFR, Estimated: 50 mL/min — ABNORMAL LOW (ref >60)
Glucose, Bld: 102 mg/dL — ABNORMAL HIGH (ref 70–99)
Potassium: 3.9 mmol/L (ref 3.5–5.1)
Sodium: 138 mmol/L (ref 135–145)

## 2022-07-30 LAB — CBC WITH DIFFERENTIAL/PLATELET
Abs Immature Granulocytes: 0.01 10*3/uL (ref 0.00–0.07)
Basophils Absolute: 0 10*3/uL (ref 0.0–0.1)
Basophils Relative: 0 %
Eosinophils Absolute: 0.1 10*3/uL (ref 0.0–0.5)
Eosinophils Relative: 2 %
HCT: 39.2 % (ref 36.0–46.0)
Hemoglobin: 12.3 g/dL (ref 12.0–15.0)
Immature Granulocytes: 0 %
Lymphocytes Relative: 14 %
Lymphs Abs: 0.6 10*3/uL — ABNORMAL LOW (ref 0.7–4.0)
MCH: 26.4 pg (ref 26.0–34.0)
MCHC: 31.4 g/dL (ref 30.0–36.0)
MCV: 84.1 fL (ref 80.0–100.0)
Monocytes Absolute: 0.4 10*3/uL (ref 0.1–1.0)
Monocytes Relative: 10 %
Neutro Abs: 3.1 10*3/uL (ref 1.7–7.7)
Neutrophils Relative %: 74 %
Platelets: 158 10*3/uL (ref 150–400)
RBC: 4.66 MIL/uL (ref 3.87–5.11)
RDW: 17.9 % — ABNORMAL HIGH (ref 11.5–15.5)
WBC: 4.2 10*3/uL (ref 4.0–10.5)
nRBC: 0 % (ref 0.0–0.2)

## 2022-07-30 LAB — PROTIME-INR
INR: 1.7 — ABNORMAL HIGH (ref 0.8–1.2)
Prothrombin Time: 20.5 seconds — ABNORMAL HIGH (ref 11.4–15.2)

## 2022-07-30 NOTE — ED Notes (Signed)
Blood pressure 86 doppler used

## 2022-07-30 NOTE — ED Notes (Signed)
Patient transported to X-ray 

## 2022-07-30 NOTE — ED Triage Notes (Signed)
Pt states left lower leg injury/ankle after hitting it on a ladder Thursday  Since has had worsening bruising and swelling  Also states starting Friday has had runny nose, cough, fatigue, and body aches   LVAD in 2019

## 2022-07-30 NOTE — Discharge Instructions (Addendum)
Thank you for allowing [place] to be part of your care today.  You were evaluated in the ED for cold-like symptoms and a left ankle pain and swelling.  Your workup today is overall reassuring.  Your ankle x-ray did not show evidence of acute fracture.  You likely have sustained a deep bruise.  I recommend continuing to use elevation and ice to help with swelling.  I also recommend taking Tylenol as needed for pain and swelling.  Your chest x-ray was negative for fluid in your lungs or pneumonia.  Your blood work showed an INR of 1.7.  Please contact your anticoagulation provider to discuss this with them.  For congestion and runny nose, I recommend taking a second-generation antihistamine such as Claritin or Zyrtec daily.  Be sure to always drink plenty of water while take these medications as they can be very drying.  I also recommend using a tablespoon of honey several times throughout the day to help with the sore throat and to calm to cough reflex.    Return to the ED if you develop sudden worsening of your symptoms or if you have any new concerns.

## 2022-07-30 NOTE — ED Provider Notes (Signed)
[facility] [facility] Provider Note   CSN: [id number] Arrival date & time: 07/30/22  1340     History  Chief Complaint  Patient presents with   URI   Sore Throat   Ankle Pain    Veronica Walker is a 62 y.o. female with past medical history significant for LVAD, hypertension, hyperlipidemia, CHF, TIA, neuropathy, ANCA-associated vasculitis, immunosuppressed with Imuran, chronic anticoagulation presents to the ED complaining of rhinorrhea, cough, sore throat, fatigue and body aches since Friday.  She endorses diarrhea that began a couple of days ago, but is already improving.  She also has bruising, swelling, and pain to her left ankle after hitting it on a ladder on Thursday.  Patient states that the swelling and bruising did not appear until 2 days later.  She has been using ice which has helped significantly with swelling.  She states that the lower left leg and ankle is very tender to touch and is uncomfortable to walk on, but she has been able to walk.  She has not taken any medications for her symptoms.  Denies fever, shortness of breath, chest tightness, chest pain, abdominal pain, nausea, vomiting.        Home Medications Prior to Admission medications   Medication Sig Start Date End Date Taking? Authorizing Provider  atorvastatin (LIPITOR) 40 MG tablet Take 40 mg by mouth daily.    [provider]  CALCIUM-VITAMIN D PO Take 1 tablet by mouth daily.    [provider]  diazepam (VALIUM) 5 MG tablet Take 1 tablet (5 mg total) by mouth every 6 (six) hours as needed for muscle spasms. 07/03/13   Estill Bamberg, MD  docusate sodium (COLACE) 100 MG capsule Take 100 mg by mouth daily.    [provider]  furosemide (LASIX) 40 MG tablet Take 40 mg by mouth daily.    [provider]  lisinopril (PRINIVIL,ZESTRIL) 2.5 MG tablet Take 5 mg by mouth 2 (two) times daily.    [provider]  LORazepam (ATIVAN)  0.5 MG tablet Take 0.5 mg by mouth every 8 (eight) hours as needed for anxiety or sleep.    [provider]  metoprolol succinate (TOPROL-XL) 25 MG 24 hr tablet Take 25-50 mg by mouth 2 (two) times daily. Take 50mg  in the morning and take 25mg  at bedtime.    [provider]  oxyCODONE-acetaminophen (PERCOCET/ROXICET) 5-325 MG per tablet Take 1-2 tablets by mouth every 4 (four) hours as needed for moderate pain. 07/03/13   Estill Bamberg, MD  potassium chloride (K-DUR,KLOR-CON) 10 MEQ tablet Take 10 mEq by mouth daily.    [provider]      Allergies    Contrast media [iodinated contrast media], Morphine and codeine, and Other    Review of Systems   Review of Systems  Constitutional:  Positive for fatigue. Negative for fever.  Respiratory:  Positive for cough. Negative for chest tightness and shortness of breath.   Cardiovascular:  Negative for chest pain.  Gastrointestinal:  Positive for diarrhea. Negative for abdominal pain, nausea and vomiting.  Musculoskeletal:  Positive for arthralgias (left lower leg and ankle) and joint swelling (left ankle).    Physical Exam Updated Vital Signs Pulse 75   Temp 97.8 F (36.6 C) (Oral)   Resp (!) 22   Ht 5\' 5"  (1.651 m)   Wt 71.2 kg   SpO2 98%   BMI 26.12 kg/m  Physical Exam Vitals and nursing note reviewed.  Constitutional:  General: She is not in acute distress.    Appearance: Normal appearance. She is not ill-appearing or diaphoretic.  HENT:     Right Ear: Tympanic membrane and ear canal normal.     Left Ear: Tympanic membrane and ear canal normal.     Nose: Congestion present. No rhinorrhea.     Mouth/Throat:     Lips: Pink.     Mouth: Mucous membranes are moist.     Pharynx: Uvula midline. Posterior oropharyngeal erythema and postnasal drip present. No pharyngeal swelling, oropharyngeal exudate or uvula swelling.  Cardiovascular:     Rate and Rhythm: Normal rate and regular rhythm.     Pulses:           Dorsalis pedis pulses are 2+ on the left side.     Comments: LVAD heard with auscultation. Pulmonary:     Effort: Pulmonary effort is normal. No tachypnea or respiratory distress.     Breath sounds: Normal breath sounds and air entry. No wheezing or rhonchi.  Musculoskeletal:     Right lower leg: No edema.     Left lower leg: Swelling, tenderness and bony tenderness present. No deformity. No edema.     Left ankle: Swelling and ecchymosis present. No deformity. Tenderness present over the lateral malleolus. Decreased range of motion.     Left Achilles Tendon: Normal.     Comments: Ecchymosis and tenderness to left lower leg just proximal to the ankle.  Tenderness to palpation of left lateral ankle.  Reduced ROM due to pain and swelling.  Foot and ankle are neurovascularly intact.    Neurological:     Mental Status: She is alert. Mental status is at baseline.  Psychiatric:        Mood and Affect: Mood normal.        Behavior: Behavior normal.     ED Results / Procedures / Treatments   Labs (all labs ordered are listed, but only abnormal results are displayed) Labs Reviewed  CBC WITH DIFFERENTIAL/PLATELET - Abnormal; Notable for the following components:      Result Value   RDW 17.9 (*)    Lymphs Abs 0.6 (*)    All other components within normal limits  BASIC METABOLIC PANEL - Abnormal; Notable for the following components:   Glucose, Bld 102 (*)    BUN 26 (*)    Creatinine, Ser 1.23 (*)    GFR, Estimated 50 (*)    All other components within normal limits  PROTIME-INR - Abnormal; Notable for the following components:   Prothrombin Time 20.5 (*)    INR 1.7 (*)    All other components within normal limits  RESP PANEL BY RT-PCR (RSV, FLU A&B, COVID)  [id number]  GROUP A STREP BY PCR    EKG None  Radiology DG Chest 2 View  Result Date: 07/30/2022 CLINICAL DATA:  Cough EXAM: CHEST - 2 VIEW COMPARISON:  Chest radiograph dated June 27, 2013 FINDINGS: The heart is enlarged.  Pacemaker leads/AICD in the right atrium and right ventricle, unchanged. Mild pulmonary vascular congestion. No focal consolidation or pleural effusion. Another battery device projecting over the left upper abdominal quadrant. Sternotomy wires and mediastinal surgical clips suggesting prior cardiac surgery, unchanged. No acute osseous abnormality. IMPRESSION: Cardiomegaly with mild pulmonary vascular congestion. No overt pulmonary edema. Electronically Signed   By: Larose Hires D.O.   On: 07/30/2022 15:45   DG Ankle Complete Left  Result Date: 07/30/2022 CLINICAL DATA:  Left ankle injury after hitting it on  a ladder. EXAM: LEFT ANKLE COMPLETE - 3+ VIEW COMPARISON:  None Available. FINDINGS: There is no evidence of fracture, dislocation, or joint effusion. There is no evidence of arthropathy or other focal bone abnormality. Surgical clips along the medial aspect of the tibia. No appreciable fluid collection. IMPRESSION: Negative. Electronically Signed   By: Larose Hires D.O.   On: 07/30/2022 15:40    Procedures Procedures    Medications Ordered in ED Medications - No data to display  ED Course/ Medical Decision Making/ A&P                             Medical Decision Making Amount and/or Complexity of Data Reviewed Labs: ordered. Radiology: ordered.   This patient presents to the ED with chief complaint(s) of cold-like symptoms, left lower leg and ankle swelling/bruising with pertinent past medical history of LVAD, ANCA vasculitis, chronic immunosuppression, chronic anticoagulation with coumadin.  The complaint involves an extensive differential diagnosis and also carries with it a high risk of complications and morbidity.    The differential diagnosis includes viral URI, COVID, RSV, influenza, adenovirus, strep pharyngitis, viral pharyngitis, pneumonia, acute fracture to left ankle, contusion, sprain   The initial plan is to obtain resp panel, strep swab, baseline labs, PT-INR, and x-rays  of chest and ankle  Additional history obtained: Records reviewed  - patient followed by [facility] for LVAD and anticoagulation.  She is seen weekly to check INR.  She has not had any changes in her medication.  Patient's INR goal is 2.5-3.5.  Initial Assessment:   Exam significant for overall well appearing patient who is not in acute distress.  Lungs are clear to auscultation bilaterally.  LVAD heard with auscultation.  Posterior oropharynx is erythematous with postnasal drip, no exudate or swelling.  Bilateral EACs and TMs unremarkable.  Left lower leg and ankle with swelling and ecchymosis.  Tenderness to anterior left lower leg and lateral ankle.  Decreased ROM due to pain and swelling.  Foot is neurovascularly intact.   Independent ECG/labs interpretation:  The following labs were independently interpreted:  CBC without leukocytosis or anemia.  Metabolic panel with creatinine and GFR around patient's baseline.  No electrolyte disturbance.  INR 1.7, which is below patient's goal.  Negative for strep, COVID, influenza, RSV.  Independent visualization and interpretation of imaging: I independently visualized the following imaging with scope of interpretation limited to determining acute life threatening conditions related to emergency care: Chest x-ray, which revealed cardiomegaly, implanted AICD/pacemaker, no pneumonia or pleural effusion.  Left ankle x-ray without acute fracture or dislocation.  Disposition:   Patient's workup today is overall reassuring.  Discussed with patient that her INR is below goal and to contact her anticoagulation clinic to discuss this.  Patient reports she does go weekly for checks.  Discussed supportive care measures for left ankle pain and swelling including RICE and the use of Tylenol.  Recommended Zyrtec or other second-generation antihistamine for congestion and postnasal drip.   The patient has been appropriately medically screened and/or stabilized in the ED. I  have low suspicion for any other emergent medical condition which would require further screening, evaluation or treatment in the ED or require inpatient management. At time of discharge the patient is hemodynamically stable and in no acute distress. I have discussed work-up results and diagnosis with patient and answered all questions. Patient is agreeable with discharge plan. We discussed strict return precautions for returning to the emergency  department and they verbalized understanding.           Final Clinical Impression(s) / ED Diagnoses Final diagnoses:  Viral URI with cough  Acute left ankle pain  Traumatic ecchymosis of left lower leg, initial encounter    Rx / DC Orders ED Discharge Orders     None         Lenard Simmer, PA-C 07/30/22 1620    Alvira Monday, MD 07/31/22 2235
